# Patient Record
Sex: Female | Born: 1985 | Race: Black or African American | Hispanic: No | Marital: Single | Smoking: Never smoker
Health system: Southern US, Community
[De-identification: ages and names within clinical notes are randomized; demographics above are authoritative.]

## PROBLEM LIST (undated history)

## (undated) DIAGNOSIS — R002 Palpitations: Secondary | ICD-10-CM

## (undated) DIAGNOSIS — M419 Scoliosis, unspecified: Secondary | ICD-10-CM

## (undated) DIAGNOSIS — E785 Hyperlipidemia, unspecified: Secondary | ICD-10-CM

## (undated) DIAGNOSIS — H9190 Unspecified hearing loss, unspecified ear: Secondary | ICD-10-CM

## (undated) DIAGNOSIS — N281 Cyst of kidney, acquired: Secondary | ICD-10-CM

## (undated) DIAGNOSIS — J309 Allergic rhinitis, unspecified: Secondary | ICD-10-CM

## (undated) DIAGNOSIS — G44219 Episodic tension-type headache, not intractable: Secondary | ICD-10-CM

## (undated) DIAGNOSIS — N200 Calculus of kidney: Secondary | ICD-10-CM

## (undated) DIAGNOSIS — G4730 Sleep apnea, unspecified: Secondary | ICD-10-CM

## (undated) DIAGNOSIS — N946 Dysmenorrhea, unspecified: Secondary | ICD-10-CM

## (undated) DIAGNOSIS — K219 Gastro-esophageal reflux disease without esophagitis: Secondary | ICD-10-CM

## (undated) DIAGNOSIS — M40209 Unspecified kyphosis, site unspecified: Secondary | ICD-10-CM

## (undated) DIAGNOSIS — R42 Dizziness and giddiness: Secondary | ICD-10-CM

## (undated) DIAGNOSIS — Q775 Diastrophic dysplasia: Secondary | ICD-10-CM

## (undated) DIAGNOSIS — E34328 Other genetic causes of short stature: Secondary | ICD-10-CM

## (undated) DIAGNOSIS — J4530 Mild persistent asthma, uncomplicated: Secondary | ICD-10-CM

## (undated) DIAGNOSIS — F419 Anxiety disorder, unspecified: Secondary | ICD-10-CM

## (undated) HISTORY — DX: Other genetic causes of short stature: E34.328

## (undated) HISTORY — DX: Allergic rhinitis, unspecified: J30.9

## (undated) HISTORY — DX: Sleep apnea, unspecified: G47.30

## (undated) HISTORY — DX: Anxiety disorder, unspecified: F41.9

## (undated) HISTORY — DX: Unspecified hearing loss, unspecified ear: H91.90

## (undated) HISTORY — DX: Dizziness and giddiness: R42

## (undated) HISTORY — DX: Cyst of kidney, acquired: N28.1

## (undated) HISTORY — DX: Dysmenorrhea, unspecified: N94.6

## (undated) HISTORY — DX: Hyperlipidemia, unspecified: E78.5

## (undated) HISTORY — DX: Calculus of kidney: N20.0

## (undated) HISTORY — DX: Gastro-esophageal reflux disease without esophagitis: K21.9

## (undated) HISTORY — DX: Mild persistent asthma, uncomplicated: J45.30

## (undated) HISTORY — DX: Episodic tension-type headache, not intractable: G44.219

## (undated) HISTORY — PX: NO PAST SURGERIES: SHX2092

---

## 2008-04-05 ENCOUNTER — Emergency Department (HOSPITAL_COMMUNITY): Admission: EM | Admit: 2008-04-05 | Discharge: 2008-04-06 | Payer: Self-pay | Admitting: Emergency Medicine

## 2009-10-10 ENCOUNTER — Emergency Department (HOSPITAL_COMMUNITY): Admission: EM | Admit: 2009-10-10 | Discharge: 2009-10-10 | Payer: Self-pay | Admitting: Family Medicine

## 2012-10-11 ENCOUNTER — Emergency Department (HOSPITAL_BASED_OUTPATIENT_CLINIC_OR_DEPARTMENT_OTHER)
Admission: EM | Admit: 2012-10-11 | Discharge: 2012-10-12 | Disposition: A | Discharge status: Home / Self Care | Payer: Medicaid Other | Attending: Emergency Medicine | Admitting: Emergency Medicine

## 2012-10-11 ENCOUNTER — Encounter (HOSPITAL_BASED_OUTPATIENT_CLINIC_OR_DEPARTMENT_OTHER): Payer: Self-pay | Admitting: *Deleted

## 2012-10-11 DIAGNOSIS — R059 Cough, unspecified: Secondary | ICD-10-CM | POA: Insufficient documentation

## 2012-10-11 DIAGNOSIS — M4120 Other idiopathic scoliosis, site unspecified: Secondary | ICD-10-CM | POA: Insufficient documentation

## 2012-10-11 DIAGNOSIS — Q789 Osteochondrodysplasia, unspecified: Secondary | ICD-10-CM | POA: Insufficient documentation

## 2012-10-11 DIAGNOSIS — R05 Cough: Secondary | ICD-10-CM | POA: Insufficient documentation

## 2012-10-11 DIAGNOSIS — M4000 Postural kyphosis, site unspecified: Secondary | ICD-10-CM | POA: Insufficient documentation

## 2012-10-11 DIAGNOSIS — R002 Palpitations: Secondary | ICD-10-CM | POA: Insufficient documentation

## 2012-10-11 HISTORY — DX: Palpitations: R00.2

## 2012-10-11 HISTORY — DX: Unspecified kyphosis, site unspecified: M40.209

## 2012-10-11 HISTORY — DX: Diastrophic dysplasia: Q77.5

## 2012-10-11 HISTORY — DX: Scoliosis, unspecified: M41.9

## 2012-10-11 LAB — CBC WITH DIFFERENTIAL/PLATELET
Basophils Relative: 1 % (ref 0–1)
Eosinophils Relative: 2 % (ref 0–5)
Lymphocytes Relative: 19 % (ref 12–46)
Lymphs Abs: 1.6 10*3/uL (ref 0.7–4.0)
MCHC: 33.6 g/dL (ref 30.0–36.0)
Monocytes Relative: 10 % (ref 3–12)
Platelets: ADEQUATE 10*3/uL (ref 150–400)

## 2012-10-11 MED ORDER — SODIUM CHLORIDE 0.9 % IV SOLN
INTRAVENOUS | Status: DC
  Administered 2012-10-11: via INTRAVENOUS

## 2012-10-11 NOTE — ED Provider Notes (Signed)
History     CSN: [id number]  Arrival date & time 10/11/12  2006   First MD Initiated Contact with Patient 10/11/12 2009      Chief Complaint  Patient presents with  . Palpitations    (Consider location/radiation/quality/duration/timing/severity/associated sxs/prior Treatment)  HPI Cindy Goodwin is a 26 y.o. female who presents to the ED with palpations. She states that her heart rate is usually 95 to 115 but for over a week she has experienced episodes of fluttering and feeling of fullness in chest and throat. The symptoms are worse with cough. She only reports an occasional cough and no congestion, fever or chills. She denies shortness of breath. The patient has congenital deformities, Diastrophic dysplasia, scoliosis, kyphosis. 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   Past Medical History  Diagnosis Date  . Scoliosis   . Palpitations   . Diastrophic dysplasia   . Kyphosis     History reviewed. No pertinent past surgical history.  History reviewed. No pertinent family history.  History  Substance Use Topics  . Smoking status: Never Smoker   . Smokeless tobacco: Not on file  . Alcohol Use: No    OB History   Grav Para Term Preterm Abortions TAB SAB Ect Mult Living                  Review of Systems  Constitutional: Negative for fever, chills, diaphoresis and fatigue.  HENT: Negative for ear pain, congestion, sore throat, facial swelling, neck pain, neck stiffness, dental problem and sinus pressure.   Eyes: Negative for photophobia, pain and discharge.  Respiratory: Positive for cough. Negative for chest tightness and wheezing.   Cardiovascular: Positive for palpitations. Negative for chest pain and leg swelling.  Gastrointestinal: Negative for nausea, vomiting, abdominal pain, diarrhea, constipation and abdominal distention.  Genitourinary:  Negative for dysuria, frequency, flank pain and difficulty urinating.  Musculoskeletal: Negative for myalgias, back pain and gait problem.  Skin: Negative for color change and rash.  Neurological: Negative for dizziness, speech difficulty, weakness, light-headedness, numbness and headaches.  Psychiatric/Behavioral: Negative for confusion and agitation.    Allergies  Review of patient's allergies indicates no known allergies.  Home Medications  No current outpatient prescriptions on file.  BP   Pulse 110  Temp(Src) 98.3 F (36.8 C) (Oral)  Resp 20  Wt 65 lb (29.484 kg)  SpO2 100%  LMP 09/20/2012  Physical Exam  Nursing note and vitals reviewed. Constitutional: She is oriented to person, place, and time. No distress.  HENT:  Head: Atraumatic.  Eyes: EOM are normal.  Neck: Neck supple. Normal carotid pulses and no JVD present.  Cardiovascular: Tachycardia present.   Pulmonary/Chest: Effort normal and breath sounds normal. No respiratory distress.  Abdominal: Soft. There is no tenderness.  Musculoskeletal:  Severe scoliosis and kyphosis  Neurological: She is alert and oriented to person, place, and time.  Skin: Skin is warm and dry.  Psychiatric: She has a normal mood and affect. Her behavior is normal. Judgment and thought content normal.   Procedures RN unable to draw blood or start IV. Dr. Bernette Mayers in to evaluate the patient and try to start IV.  IV started and labs drawn.  Normal K   Assessment: 27 y.o. female with palpations   Patient does not appear to be at risk for PE or DVT  Plan:  Discussed with  the patient in detail need for further evaluation with a cardiologist   Patient voices understanding.    Discussed lab results with the patient. I discussed with the patient that if her symptoms worsen she should return here.  The patient's lab work and  EKG have been reviewed by Dr. Nicanor Alcon and Dr. Bernette Mayers and they feel that the patient can be discharged home to follow  up with Cardiology.    I discussed with the patient in detail need for follow up with Cardiology and to return if symptoms worsen. Patient voices understanding.  [place], NP 10/11/12 [address] NP 10/13/12 1104

## 2012-10-11 NOTE — ED Notes (Signed)
Pt states her HR usually runs 95-115, but for the past 1-1/2 weeks she has noticed episodes of fluttering feeling in chest and throat. Worse when coughing. Denies other s/s.

## 2012-10-11 NOTE — Procedures (Signed)
Asked by Dr Bernette Mayers to attempt arteral puncture to obtain blood for lab. Attempted X 1 to Lf radial with no success. Positive Allens test. Pt tolerated well.site held for 5 minutes. No bleeding noted

## 2012-10-12 LAB — COMPREHENSIVE METABOLIC PANEL: Chloride: 102 mEq/L (ref 96–112)

## 2012-10-13 NOTE — ED Provider Notes (Signed)
Medical screening examination/treatment/procedure(s) were conducted as a shared visit with non-physician practitioner(s) and myself.  I personally evaluated the patient during the encounter  Pt with multiple congenital skeletal deformities has palpitations in addition to baseline tachycardia. Multiple attempts at IV access and blood draws. I attempted US guided EJ unsuccessfully. Care signed out at the change of shift.   [name] B. Bernette Mayers, MD 10/13/12 1148

## 2012-12-15 ENCOUNTER — Institutional Professional Consult (permissible substitution): Payer: Self-pay | Admitting: Internal Medicine

## 2013-01-22 ENCOUNTER — Ambulatory Visit: Payer: Medicaid Other | Admitting: Physical Therapy

## 2013-01-29 ENCOUNTER — Ambulatory Visit: Payer: Medicaid Other | Admitting: Physical Therapy

## 2013-09-29 ENCOUNTER — Emergency Department (HOSPITAL_BASED_OUTPATIENT_CLINIC_OR_DEPARTMENT_OTHER)
Admission: EM | Admit: 2013-09-29 | Discharge: 2013-09-29 | Disposition: A | Discharge status: Home / Self Care | Payer: Medicaid Other | Attending: Emergency Medicine | Admitting: Emergency Medicine

## 2013-09-29 ENCOUNTER — Encounter (HOSPITAL_BASED_OUTPATIENT_CLINIC_OR_DEPARTMENT_OTHER): Payer: Self-pay | Admitting: Emergency Medicine

## 2013-09-29 ENCOUNTER — Emergency Department (HOSPITAL_BASED_OUTPATIENT_CLINIC_OR_DEPARTMENT_OTHER): Payer: Medicaid Other

## 2013-09-29 DIAGNOSIS — J069 Acute upper respiratory infection, unspecified: Secondary | ICD-10-CM | POA: Insufficient documentation

## 2013-09-29 DIAGNOSIS — R6252 Short stature (child): Secondary | ICD-10-CM | POA: Insufficient documentation

## 2013-09-29 DIAGNOSIS — Q788 Other specified osteochondrodysplasias: Secondary | ICD-10-CM | POA: Insufficient documentation

## 2013-09-29 DIAGNOSIS — M4120 Other idiopathic scoliosis, site unspecified: Secondary | ICD-10-CM | POA: Insufficient documentation

## 2013-09-29 DIAGNOSIS — R000 Tachycardia, unspecified: Secondary | ICD-10-CM | POA: Insufficient documentation

## 2013-09-29 MED ORDER — AZITHROMYCIN 100 MG/5ML PO SUSR: 150.0000 mg | Freq: Every day | ORAL | Status: AC

## 2013-09-29 MED ORDER — HYDROCODONE-ACETAMINOPHEN 7.5-325 MG/15ML PO SOLN: ORAL | Status: DC

## 2013-09-29 MED ORDER — AZITHROMYCIN 200 MG/5ML PO SUSR
300.0000 mg | Freq: Once | ORAL | Status: AC
  Administered 2013-09-29: 300 mg via ORAL
  Filled 2013-09-29: qty 10

## 2013-09-29 MED ORDER — BENZONATATE 100 MG PO CAPS: 100.0000 mg | ORAL_CAPSULE | Freq: Three times a day (TID) | ORAL | Status: DC

## 2013-09-29 MED ORDER — ACETAMINOPHEN 160 MG/5ML PO SOLN
450.0000 mg | Freq: Once | ORAL | Status: AC
Start: 2013-09-29 — End: 2013-09-29
  Administered 2013-09-29: 450 mg via ORAL
  Filled 2013-09-29: qty 20.3

## 2013-09-29 NOTE — ED Provider Notes (Signed)
CSN: [id number]     Arrival date & time 09/29/13  1605 History   First MD Initiated Contact with Patient 09/29/13 1719     Chief Complaint  Patient presents with  . Fever     (Consider location/radiation/quality/duration/timing/severity/associated sxs/prior Treatment) HPI 28 year old female with diastrophic dysplasia, kyphosis, and scoliosis with rhinorrhea, cough, and sneezing for the past 3 days. She has been taking by mouth as normal. She lives independently with her sister. She states she has been taking Tylenol which she takes for the appropriate wait. She had not noted that she had a fever at home. She last took Tylenol for 4 hours ago. She has been eating and drinking without difficulty. She has not had any vomiting or diarrhea. She felt that she was using some accessory muscles to breathe and thought she should be evaluated secondary to this but has not noted any productive cough or shortness of breath. Past Medical History  Diagnosis Date  . Scoliosis   . Palpitations   . Diastrophic dysplasia   . Kyphosis    History reviewed. No pertinent past surgical history. No family history on file. History  Substance Use Topics  . Smoking status: Never Smoker   . Smokeless tobacco: Not on file  . Alcohol Use: No   OB History   Grav Para Term Preterm Abortions TAB SAB Ect Mult Living                 Review of Systems  All other systems reviewed and are negative.      Allergies  Review of patient's allergies indicates no known allergies.  Home Medications  No current outpatient prescriptions on file. Pulse 102  Temp(Src) 100.5 F (38.1 C) (Oral)  Resp 20  Wt 68 lb (30.845 kg) Physical Exam  Nursing note and vitals reviewed. Constitutional: She is oriented to person, place, and time. She is not intubated.  Patient is extremely short stature very short arms and legs and scoliosis with abnormal shape to chest and trunk.  HENT:  Head: Atraumatic.  Nose: Nose normal.   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 Is unable to visualize her tympanic membranes.  Eyes: Conjunctivae and EOM are normal. Pupils are equal, round, and reactive to light.  Neck: Normal range of motion. Neck supple.  Cardiovascular: Tachycardia present.   Pulmonary/Chest: Breath sounds normal. No accessory muscle usage. No apnea, not tachypneic and not bradypneic. She is not intubated. No respiratory distress.  Abdominal: Normal appearance and bowel sounds are normal. There is no tenderness.  Musculoskeletal:  Foreshortened arms and legs.  Neurological: She is alert and oriented to person, place, and time. No cranial nerve deficit. Coordination normal.  Skin: Skin is warm and dry. No rash noted. No erythema. No pallor.  Psychiatric: She has a normal mood and affect. Her behavior is normal. Judgment and thought content normal.    ED Course  Procedures (including critical care time) Labs Review Labs Reviewed - No data to display Imaging Review Dg Chest 2 View  09/29/2013   CLINICAL DATA:  Cough and fever and chest pain.  EXAM: CHEST  2 VIEW  COMPARISON:  Chest x-rays dated 10/10/2009 and 04/05/2008  FINDINGS: There are no appreciable infiltrates or effusions. Heart size and pulmonary vascularity appear normal. There is marked deformity of the thoracic cage with a severe thoracic scoliosis. There is marked deformity of the shoulders and humeri.  IMPRESSION: No appreciable acute cardiopulmonary disease.   Electronically Signed   By: Clair Gulling  Maxwell M.D.   On: 09/29/2013 18:31      MDM    No obvious infiltrate on chest x-Aitanna Haubner. She is tachycardic here at 110 but states that this is her baseline. She has not been dyspneic and has normal oxygen saturations. Her mother requests that she be started on antibiotics doubt that is how she has been handled with URI symptoms in the past. I think that given her congenital abnormalities it is reasonable to start antibiotics. She will be  placed on Zithromax. They are advised to return or she appears worse at any time.  Shaune Pollack, MD 09/29/13 [phone]

## 2013-09-29 NOTE — ED Notes (Signed)
Fever  And cough x 2 days

## 2013-09-29 NOTE — Discharge Instructions (Signed)

## 2016-08-28 ENCOUNTER — Encounter: Payer: Medicaid Other | Attending: Internal Medicine | Admitting: Dietician

## 2016-08-28 ENCOUNTER — Encounter: Payer: Self-pay | Admitting: Dietician

## 2016-08-28 DIAGNOSIS — E343 Short stature due to endocrine disorder: Secondary | ICD-10-CM | POA: Insufficient documentation

## 2016-08-28 DIAGNOSIS — Z713 Dietary counseling and surveillance: Secondary | ICD-10-CM | POA: Insufficient documentation

## 2016-08-28 DIAGNOSIS — R635 Abnormal weight gain: Secondary | ICD-10-CM

## 2016-08-28 NOTE — Patient Instructions (Signed)
Consider using the Calorie [employer] app to compare food choices when eating out. Consider splitting a restaurant meal with someone or getting a to-go box at the beginning of the meal. Choose low fat foods and avoid adding a lot of added fat. Baked, broiled or grilled rather than fried. Stop eating when you are satisfied.  Consider eating away from the computer. For snacks, think of what is nutrient dense.  Put small portion in a bowl and avoid bringing the container. Consider getting back to the pool.

## 2016-08-28 NOTE — Progress Notes (Signed)
  Medical Nutrition Therapy:  Appt start time: 1410 end time:  1450.   Assessment:  Primary concerns today: Patient is here today with her sister.  Patient would like to lose weight.  She has dwarfism.   Weight was 65 lbs about 4 years ago and 73 lbs today.  She would like to return to 65 lbs.  Patient lives with her sister.  She has an Environmental consultant that comes each morning and she prepares a lot of her meals.  She works as a Education administrator.  Preferred Learning Style:   No preference indicated   Learning Readiness:   Ready  MEDICATIONS: see list   DIETARY INTAKE: Sleeps from 4 am-9 am, awake 9-10, then sleeps from 10-12. (7 hours sleep per night. Usual eating pattern includes 2 meals and 1 snacks per day. Eats in front of the computer. Everyday foods include eating out.  Avoided foods include less meat over the past month.  Fast at church currently (no bread or sweets or meat) but then resumes.  "more energy, cleaner"  States that she will eat past full "becasue it is good."  24-hr recall:  B ( AM): skips  Snk ( AM): peppermint or green tea with 1 spoon honey or sugar L (12 PM): cooked oatmeal, strawberries, and almond milk OR yogurt OR eggs and toast Snk ( PM): none D (4-5 PM): eats out a lot (not fast food usually) eats about 1/2:  Chinese (fried rice and sesame chicken, wonton soup) Snk (10 PM): cookies or ice cream or other sweet Beverages: juice before the new year and now more water  Usual physical activity: limited.  She will walk for 3-5 minutes.  Generally uses a wheel chair.  Enjoys swimming.  Progress Towards Goal(s):  In progress.   Nutritional Diagnosis:  NB-1.1 Food and nutrition-related knowledge deficit As related to nutrition for weight management.  As evidenced by weight gain.    Intervention:  Nutrition counseling/education related to healthy eating/mindful eating.  Discussed mindful choices, listening to her body.  Discussed benefits of physical activity  within her limitations.  Consider using the Calorie [employer] app to compare food choices when eating out. Consider splitting a restaurant meal with someone or getting a to-go box at the beginning of the meal. Choose low fat foods and avoid adding a lot of added fat. Baked, broiled or grilled rather than fried. Stop eating when you are satisfied.  Consider eating away from the computer. For snacks, think of what is nutrient dense.  Put small portion in a bowl and avoid bringing the container. Consider getting back to the pool.  Teaching Method Utilized:  Visual Auditory Hands on  Handouts given during visit include:  My plate  Snack list  Barriers to learning/adherence to lifestyle change: dwarfism and uses electric wheel chair  Demonstrated degree of understanding via:  Teach Back   Monitoring/Evaluation:  Dietary intake, exercise, and body weight prn.

## 2017-12-31 ENCOUNTER — Ambulatory Visit (INDEPENDENT_AMBULATORY_CARE_PROVIDER_SITE_OTHER): Payer: Medicaid Other | Admitting: Allergy and Immunology

## 2017-12-31 ENCOUNTER — Ambulatory Visit
Admission: RE | Admit: 2017-12-31 | Discharge: 2017-12-31 | Disposition: A | Discharge status: Home / Self Care | Payer: Medicaid Other | Source: Ambulatory Visit | Attending: Allergy and Immunology | Admitting: Allergy and Immunology

## 2017-12-31 ENCOUNTER — Encounter: Payer: Self-pay | Admitting: Allergy and Immunology

## 2017-12-31 VITALS — HR 82 | Temp 98.2°F | Resp 17 | Ht <= 58 in | Wt 73.0 lb

## 2017-12-31 DIAGNOSIS — R05 Cough: Secondary | ICD-10-CM

## 2017-12-31 DIAGNOSIS — J310 Chronic rhinitis: Secondary | ICD-10-CM | POA: Insufficient documentation

## 2017-12-31 DIAGNOSIS — J4530 Mild persistent asthma, uncomplicated: Secondary | ICD-10-CM | POA: Diagnosis not present

## 2017-12-31 DIAGNOSIS — K219 Gastro-esophageal reflux disease without esophagitis: Secondary | ICD-10-CM | POA: Insufficient documentation

## 2017-12-31 DIAGNOSIS — R053 Chronic cough: Secondary | ICD-10-CM

## 2017-12-31 HISTORY — DX: Mild persistent asthma, uncomplicated: J45.30

## 2017-12-31 MED ORDER — AZELASTINE HCL 0.1 % NA SOLN: 2.0000 | Freq: Two times a day (BID) | NASAL | 5 refills | Status: DC

## 2017-12-31 MED ORDER — FLUTICASONE PROPIONATE HFA 110 MCG/ACT IN AERO: 2.0000 | INHALATION_SPRAY | Freq: Two times a day (BID) | RESPIRATORY_TRACT | 5 refills | Status: DC

## 2017-12-31 MED ORDER — ALBUTEROL SULFATE HFA 108 (90 BASE) MCG/ACT IN AERS: 1.0000 | INHALATION_SPRAY | Freq: Four times a day (QID) | RESPIRATORY_TRACT | 1 refills | Status: AC | PRN

## 2017-12-31 MED ORDER — KARBINAL ER 4 MG/5ML PO SUER: 6.0000 mg | Freq: Two times a day (BID) | ORAL | 5 refills | Status: DC

## 2017-12-31 NOTE — Assessment & Plan Note (Signed)
Todays spirometry results, assessed while asymptomatic, suggest under-perception of bronchoconstriction.  A prescription has been provided for Flovent (fluticasone) 110 g, 2 inhalations twice a day. To maximize pulmonary deposition, a spacer has been provided along with instructions for its proper administration with an HFA inhaler.  A prescription has been provided for albuterol HFA, 1 to 2 inhalations every 6 hours if needed.  Subjective and objective measures of pulmonary function will be followed and the treatment plan will be adjusted accordingly. 

## 2017-12-31 NOTE — Assessment & Plan Note (Deleted)
   A prescription has been provided for [facility] ER (carbinoxamine) 6 mg twice daily as needed.  A prescription has been provided for azelastine nasal spray, 1-2 sprays per nostril 2 times daily as needed. Proper nasal spray technique has been discussed and demonstrated.   Nasal saline spray (i.e., Simply Saline) or nasal saline lavage (i.e., NeilMed) is recommended as needed and prior to medicated nasal sprays.

## 2017-12-31 NOTE — Assessment & Plan Note (Signed)
   A prescription has been provided for [facility] ER (carbinoxamine) 6 mg twice daily as needed.  A prescription has been provided for azelastine nasal spray, 1-2 sprays per nostril 2 times daily as needed. Proper nasal spray technique has been discussed and demonstrated.   Nasal saline spray (i.e., Simply Saline) or nasal saline lavage (i.e., NeilMed) is recommended as needed and prior to medicated nasal sprays.

## 2017-12-31 NOTE — Assessment & Plan Note (Addendum)
The most common causes of chronic cough include the following: upper airway cough syndrome (UACS) which is caused by variety of rhinosinus conditions; asthma; gastroesophageal reflux disease (GERD); chronic bronchitis from cigarette smoking or other inhaled environmental irritants; non-asthmatic eosinophilic bronchitis; and bronchiectasis. In prospective studies, these conditions have accounted for up to 94% of the causes of chronic cough in immunocompetent adults. The history and physical examination suggest that her cough may be multifactorial with contribution from bronchial hyper-responsiveness and possible postnasal drainage. We will address these issues at this time.   A prescription has been provided for a flutter valve to be used as needed to break the coughing cycle.  We will recheck chest x-ray as it is been 4 years since her last study.  Treatment plan as outlined below.

## 2017-12-31 NOTE — Patient Instructions (Addendum)
Cough, persistent The most common causes of chronic cough include the following: upper airway cough syndrome (UACS) which is caused by variety of rhinosinus conditions; asthma; gastroesophageal reflux disease (GERD); chronic bronchitis from cigarette smoking or other inhaled environmental irritants; non-asthmatic eosinophilic bronchitis; and bronchiectasis. In prospective studies, these conditions have accounted for up to 94% of the causes of chronic cough in immunocompetent adults. The history and physical examination suggest that her cough may be multifactorial with contribution from bronchial hyper-responsiveness and possible postnasal drainage. We will address these issues at this time.   A prescription has been provided for a flutter valve to be used as needed to break the coughing cycle.  We will recheck chest x-ray as it is been 4 years since her last study.  Treatment plan as outlined below.    Mild persistent asthma/cough variant asthma Todays spirometry results, assessed while asymptomatic, suggest under-perception of bronchoconstriction.  A prescription has been provided for Flovent (fluticasone) 110 g,  2 inhalations twice a day. To maximize pulmonary deposition, a spacer has been provided along with instructions for its proper administration with an HFA inhaler.  A prescription has been provided for albuterol HFA, 1 to 2 inhalations every 6 hours if needed.  Subjective and objective measures of pulmonary function will be followed and the treatment plan will be adjusted accordingly.  Nonallergic rhinitis  A prescription has been provided for [facility] ER (carbinoxamine) 6 mg twice daily as needed.  A prescription has been provided for azelastine nasal spray, 1-2 sprays per nostril 2 times daily as needed. Proper nasal spray technique has been discussed and demonstrated.   Nasal saline spray (i.e., Simply Saline) or nasal saline lavage (i.e., NeilMed) is recommended as needed and  prior to medicated nasal sprays.   Return in about 3 months (around 04/02/2018), or if symptoms worsen or fail to improve.

## 2017-12-31 NOTE — Progress Notes (Signed)
New Patient Note  RE: Cindy Goodwin MRN: [id number] DOB: June 05, 1986 Date of Office Visit: 12/31/2017  Referring provider: Nolene Ebbs, MD Primary care provider: Nolene Ebbs, MD  Chief Complaint: Cough and Nasal Congestion   History of present illness: Cindy Goodwin is a 32 y.o. female seen today in consultation requested by Nolene Ebbs, MD.  She complains of a persistent cough which she has had over the past 5 to 8 years.  She has tried multiple antihistamines, including loratadine, cetirizine, and levocetirizine without benefit.  In addition, she has tried albuterol without perceived benefit.  Cough syrups and Tessalon Perles have provided minimal/moderate temporary relief.  She reports that at times she has coughing fits to the point of almost vomiting.  The cough is described as nonproductive and at times originates at the base of her throat and other times originates in her chest.  There is no diurnal variation.  The cough is at its worst from October through December and March through July.  She states the cough "abruptly stops" in January and February and from July through September.  She denies chest tightness and wheezing.  She denies heartburn and water brash.  She does experience nasal congestion, rhinorrhea, and sneezing. These symptoms are most frequent and severe during the springtime.  She does not recall ever having had a chest x-ray.  She has not been evaluated for this problem by an otolaryngologist or pulmonologist.  Assessment and plan: Cough, persistent The most common causes of chronic cough include the following: upper airway cough syndrome (UACS) which is caused by variety of rhinosinus conditions; asthma; gastroesophageal reflux disease (GERD); chronic bronchitis from cigarette smoking or other inhaled environmental irritants; non-asthmatic eosinophilic bronchitis; and bronchiectasis. In prospective studies, these conditions have accounted for up to 94% of the  causes of chronic cough in immunocompetent adults. The history and physical examination suggest that her cough may be multifactorial with contribution from bronchial hyper-responsiveness and possible postnasal drainage. We will address these issues at this time.   A prescription has been provided for a flutter valve to be used as needed to break the coughing cycle.  We will recheck chest x-ray as it is been 4 years since her last study.  Treatment plan as outlined below.    Mild persistent asthma/cough variant asthma Todays spirometry results, assessed while asymptomatic, suggest under-perception of bronchoconstriction.  A prescription has been provided for Flovent (fluticasone) 110 g,  2 inhalations twice a day. To maximize pulmonary deposition, a spacer has been provided along with instructions for its proper administration with an HFA inhaler.  A prescription has been provided for albuterol HFA, 1 to 2 inhalations every 6 hours if needed.  Subjective and objective measures of pulmonary function will be followed and the treatment plan will be adjusted accordingly.  Nonallergic rhinitis  A prescription has been provided for [facility] ER (carbinoxamine) 6 mg twice daily as needed.  A prescription has been provided for azelastine nasal spray, 1-2 sprays per nostril 2 times daily as needed. Proper nasal spray technique has been discussed and demonstrated.   Nasal saline spray (i.e., Simply Saline) or nasal saline lavage (i.e., NeilMed) is recommended as needed and prior to medicated nasal sprays.   Meds ordered this encounter  Medications  . albuterol (PROVENTIL HFA;VENTOLIN HFA) 108 (90 Base) MCG/ACT inhaler    Sig: Inhale 1-2 puffs into the lungs every 6 (six) hours as needed for wheezing or shortness of breath.    Dispense:  18 g  Refill:  1  . KARBINAL ER 4 MG/5ML SUER    Sig: Take 6 mg by mouth 2 (two) times daily.    Dispense:  480 mL    Refill:  5  . fluticasone (FLOVENT  HFA) 110 MCG/ACT inhaler    Sig: Inhale 2 puffs into the lungs 2 (two) times daily. With spacer    Dispense:  1 Inhaler    Refill:  5  . azelastine (ASTELIN) 0.1 % nasal spray    Sig: Place 2 sprays into both nostrils 2 (two) times daily.    Dispense:  30 mL    Refill:  5    Diagnostics: Spirometry: FVC was 0.47 L and FEV1 was 0.39 L with significant (18%) postbronchodilator improvement.  This study was performed while the patient was asymptomatic.  Please see scanned spirometry results for details. Epicutaneous testing: Negative despite a positive histamine control. Intradermal testing: Negative. Chest xray 2 view interpretation: Chronic changes without acute abnormalities.    Physical examination: Pulse 82, temperature 98.2 F (36.8 C), temperature source Oral, resp. rate 17, height 2\' 2"  (0.66 m), weight 73 lb (33.1 kg), SpO2 97 %.  General: Dwarfism, alert, interactive, in no acute distress. HEENT: TMs pearly gray, turbinates moderately edematous without discharge, post-pharynx moderately erythematous. Neck: Supple without lymphadenopathy. Lungs: Clear to auscultation without wheezing, rhonchi or rales. CV: Normal S1, S2 without murmurs. Abdomen: Nondistended, nontender. Skin: Warm and dry, without lesions or rashes. Extremities:  No clubbing, cyanosis or edema. Neuro:   Grossly intact.  Review of systems:  Review of systems negative except as noted in HPI / PMHx or noted below: Review of Systems  Constitutional: Negative.   HENT: Negative.   Eyes: Negative.   Respiratory: Negative.   Cardiovascular: Negative.   Gastrointestinal: Negative.   Genitourinary: Negative.   Musculoskeletal: Negative.   Skin: Negative.   Neurological: Negative.   Endo/Heme/Allergies: Negative.   Psychiatric/Behavioral: Negative.     Past medical history:  Past Medical History:  Diagnosis Date  . Diastrophic dysplasia   . Kyphosis   . Mild persistent asthma 12/31/2017  . Palpitations    . Scoliosis     Past surgical history:  Past Surgical History:  Procedure Laterality Date  . NO PAST SURGERIES      Family history: Family History  Problem Relation Age of Onset  . Allergic rhinitis Maternal Aunt   . Allergic rhinitis Maternal Uncle     Social history: Social History   Socioeconomic History  . Marital status: Single    Spouse name: Not on file  . Number of children: Not on file  . Years of education: Not on file  . Highest education level: Not on file  Occupational History  . Not on file  Social Needs  . Financial resource strain: Not on file  . Food insecurity:    Worry: Not on file    Inability: Not on file  . Transportation needs:    Medical: Not on file    Non-medical: Not on file  Tobacco Use  . Smoking status: Never Smoker  . Smokeless tobacco: Never Used  Substance and Sexual Activity  . Alcohol use: No  . Drug use: No  . Sexual activity: Never    Birth control/protection: None  Lifestyle  . Physical activity:    Days per week: Not on file    Minutes per session: Not on file  . Stress: Not on file  Relationships  . Social connections:    Talks  on phone: Not on file    Gets together: Not on file    Attends religious service: Not on file    Active member of club or organization: Not on file    Attends meetings of clubs or organizations: Not on file    Relationship status: Not on file  . Intimate partner violence:    Fear of current or ex partner: Not on file    Emotionally abused: Not on file    Physically abused: Not on file    Forced sexual activity: Not on file  Other Topics Concern  . Not on file  Social History Narrative  . Not on file   Environmental History: The patient lives in an apartment with carpeting in the bedroom and central air/heat.  She is a non-smoker without pets.  There is no known mold/water damage in the home.  Allergies as of 12/31/2017   No Known Allergies     Medication List        Accurate as  of 12/31/17  5:14 PM. Always use your most recent med list.          albuterol 108 (90 Base) MCG/ACT inhaler Commonly known as:  PROVENTIL HFA;VENTOLIN HFA Inhale 1-2 puffs into the lungs every 6 (six) hours as needed for wheezing or shortness of breath.   azelastine 0.1 % nasal spray Commonly known as:  ASTELIN Place 2 sprays into both nostrils 2 (two) times daily.   benzonatate 100 MG capsule Commonly known as:  TESSALON Take 1 capsule (100 mg total) by mouth every 8 (eight) hours.   fluticasone 110 MCG/ACT inhaler Commonly known as:  FLOVENT HFA Inhale 2 puffs into the lungs 2 (two) times daily. With spacer   HYDROcodone-acetaminophen 7.5-325 mg/15 ml solution Commonly known as:  HYCET 5 ml po q four hours prn cough   KARBINAL ER 4 MG/5ML Suer Generic drug:  Carbinoxamine Maleate ER Take 6 mg by mouth 2 (two) times daily.       Known medication allergies: No Known Allergies  I appreciate the opportunity to take part in [facility] care. Please do not hesitate to contact me with questions.  Sincerely,   R. Edgar Frisk, MD

## 2018-06-06 ENCOUNTER — Emergency Department (HOSPITAL_BASED_OUTPATIENT_CLINIC_OR_DEPARTMENT_OTHER)
Admission: EM | Admit: 2018-06-06 | Discharge: 2018-06-06 | Disposition: A | Discharge status: Home / Self Care | Payer: Medicaid Other | Attending: Emergency Medicine | Admitting: Emergency Medicine

## 2018-06-06 ENCOUNTER — Other Ambulatory Visit: Payer: Self-pay

## 2018-06-06 ENCOUNTER — Encounter (HOSPITAL_BASED_OUTPATIENT_CLINIC_OR_DEPARTMENT_OTHER): Payer: Self-pay

## 2018-06-06 DIAGNOSIS — Z79899 Other long term (current) drug therapy: Secondary | ICD-10-CM | POA: Insufficient documentation

## 2018-06-06 DIAGNOSIS — K429 Umbilical hernia without obstruction or gangrene: Secondary | ICD-10-CM

## 2018-06-06 DIAGNOSIS — R1907 Generalized intra-abdominal and pelvic swelling, mass and lump: Secondary | ICD-10-CM | POA: Diagnosis present

## 2018-06-06 NOTE — ED Triage Notes (Signed)
C/o lump area to umbilicus x 2 days-was sent from [place] for possible hernia per pt-pt to triage in own w/c-NAD

## 2018-06-06 NOTE — ED Provider Notes (Addendum)
[facility] EMERGENCY DEPARTMENT Provider Note   CSN: [id number] Arrival date & time: 06/06/18  1343     History   Chief Complaint Chief Complaint  Patient presents with  . Mass    HPI Cindy Goodwin is a 32 y.o. female resenting for evaluation of abdominal mass.  Patient states over the past year, she has had intermittent umbilical discomfort.  Discomfort is normally resolved with having a bowel movement.  However, over the past 24 hours, discomfort has remained constant.  She states it feels abnormal, but she is not in acute pain.  She denies fevers, chills, chest pain, shortness breath, nausea, vomiting, urinary symptoms, abnormal bowel movements.  She has not tried anything for pain including Tylenol or ibuprofen.  She denies being told that she had a hernia in the past.  She has no history of abdominal surgeries.  Is tolerating p.o. without difficulty.  [facility] to the [facility] for further evaluation.  HPI  Past Medical History:  Diagnosis Date  . Diastrophic dysplasia   . Kyphosis   . Mild persistent asthma 12/31/2017  . Palpitations   . Scoliosis     Patient Active Problem List   Diagnosis Date Noted  . Cough, persistent 12/31/2017  . 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 Mild persistent asthma/cough variant asthma 12/31/2017  . Chronic rhinitis 12/31/2017    Past Surgical History:  Procedure Laterality Date  . NO PAST SURGERIES       OB History   None      Home Medications    Prior to Admission medications   Medication Sig Start Date End Date Taking? Authorizing Provider  albuterol (PROVENTIL HFA;VENTOLIN HFA) 108 (90 Base) MCG/ACT inhaler Inhale 1-2 puffs into the lungs every 6 (six) hours as needed for wheezing or shortness of breath. 12/31/17   Bobbitt, Sedalia Muta, MD  azelastine (ASTELIN) 0.1 % nasal spray Place 2 sprays into both nostrils 2 (two) times daily. 12/31/17   Bobbitt, Sedalia Muta, MD  benzonatate (TESSALON)  100 MG capsule Take 1 capsule (100 mg total) by mouth every 8 (eight) hours. Patient not taking: Reported on 08/28/2016 09/29/13   Pattricia Boss, MD  fluticasone (FLOVENT HFA) 110 MCG/ACT inhaler Inhale 2 puffs into the lungs 2 (two) times daily. With spacer 12/31/17   Bobbitt, Sedalia Muta, MD  HYDROcodone-acetaminophen (HYCET) 7.5-325 mg/15 ml solution 5 ml po q four hours prn cough Patient not taking: Reported on 08/28/2016 09/29/13   Pattricia Boss, MD  [facility] ER 4 MG/5ML SUER Take 6 mg by mouth 2 (two) times daily. 12/31/17   Bobbitt, Sedalia Muta, MD    Family History Family History  Problem Relation Age of Onset  . Allergic rhinitis Maternal Aunt   . Allergic rhinitis Maternal Uncle     Social History Social History   Tobacco Use  . Smoking status: Never Smoker  . Smokeless tobacco: Never Used  Substance Use Topics  . Alcohol use: No  . Drug use: No     Allergies   Patient has no known allergies.   Review of Systems Review of Systems  Gastrointestinal:       Umbilical discomfort and mass  All other systems reviewed and are negative.    Physical Exam Updated Vital Signs BP 122/77 (BP Location: Right Arm)   Pulse (!) 110   Temp 98.4 F (36.9 C) (Oral)   Resp 20   Wt 32.7 kg   LMP 05/30/2018   SpO2 98%  BMI 74.88 kg/m   Physical Exam  Constitutional: She is oriented to person, place, and time. She appears well-developed and well-nourished. No distress.  Resting comfortably in the bed in no acute distress. 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  Eyes: Pupils are equal, round, and reactive to light. Conjunctivae and EOM are normal.  Neck: Normal range of motion. Neck supple.  Cardiovascular: Regular rhythm and intact distal pulses.  tachycardic  Pulmonary/Chest: Effort normal and breath sounds normal. No respiratory distress. She has no wheezes.  Abdominal: Soft. She exhibits no distension and no mass. There is no  tenderness. There is no rebound and no guarding. A hernia is present. Hernia confirmed positive in the ventral area.  Soft, nontender, reducible umbilical hernia.  Otherwise abdomen is soft and nontender.  No rigidity, guarding, distention.  No other masses palpated.  Negative rebound.  No signs of peritonitis.  Musculoskeletal: Normal range of motion.  Neurological: She is alert and oriented to person, place, and time.  Skin: Skin is warm and dry. Capillary refill takes less than 2 seconds.  Psychiatric: She has a normal mood and affect.  Nursing note and vitals reviewed.    ED Treatments / Results  Labs (all labs ordered are listed, but only abnormal results are displayed) Labs Reviewed - No data to display  EKG None  Radiology No results found.  Procedures Procedures (including critical care time)  Medications Ordered in ED Medications - No data to display   Initial Impression / Assessment and Plan / ED Course  I have reviewed the triage vital signs and the nursing notes.  Pertinent labs & imaging results that were available during my care of the patient were reviewed by me and considered in my medical decision making (see chart for details).     Pt presenting for evaluation of possible umbilical hernia.  Physical exam reassuring, she is afebrile and appears nontoxic.  Heart rate is mildly elevated, patient states she is very anxious about what is going on, and states that she normally runs in the tachycardic side. I do not believe tachycardia is due to patients condition.  HR improved as pt was calmed. Abdominal exam is reassuring, she has a soft, nontender, and reducible umbilical hernia.  No signs of incarceration on physical exam.  No abdominal tenderness.  Per history, low suspicion for incarceration.  Discussed findings with patient.  Discussed etiology of hernias, and signs of incarcerated/strangulated hernia.  Discussed follow-up with general surgery for further  evaluation and management.  Case discussed with attending, Dr. Laverta Baltimore evaluated the patient.  At this time, patient appears safe for discharge.  Return precautions given.  Patient states she understands and agrees to plan.   Final Clinical Impressions(s) / ED Diagnoses   Final diagnoses:  Umbilical hernia without obstruction and without gangrene    ED Discharge Orders    None          Franchot Heidelberg, PA-C 06/06/18 1453    Long, Wonda Olds, MD 06/06/18 [phone]

## 2018-06-06 NOTE — ED Notes (Signed)
C/o abd mass x 2 days  slight dull ache

## 2018-06-06 NOTE — Discharge Instructions (Addendum)
Follow-up with surgery regarding hernia and options of management. You may use tylenol/ibuprofen as needed for pain. Return to the emergency room if you develop high fevers, severe pain, redness/hardness over the bellybutton, persistent vomiting, difficulty with having a bowel movement, or any new or concerning symptoms.

## 2018-11-04 IMAGING — CR DG CHEST 2V
2 series · 2 of 2 positions shown · non-contrast
Comparison: 09/29/2013

CLINICAL DATA: Intermittent dry cough

EXAM:
CHEST - 2 VIEW

[w chest pa 8-[id] (15-22cm)]
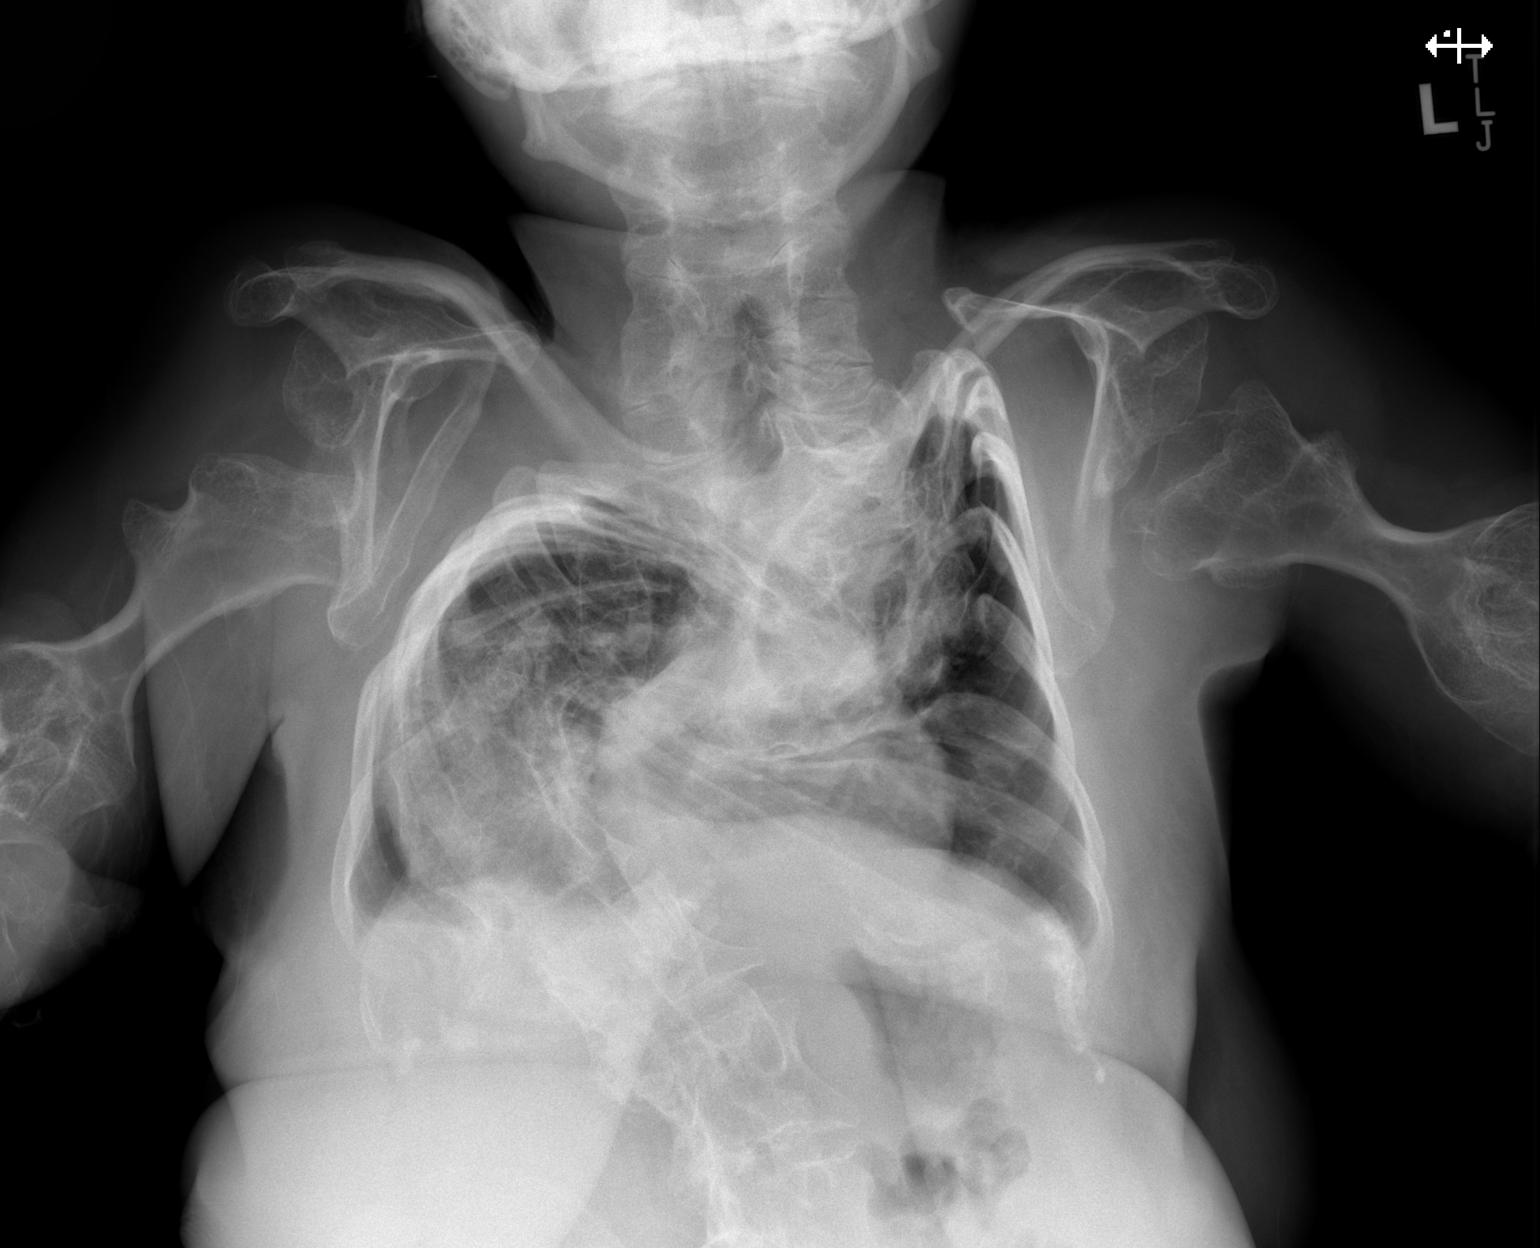

[w chest lat 8-[id] (21-28cm)]
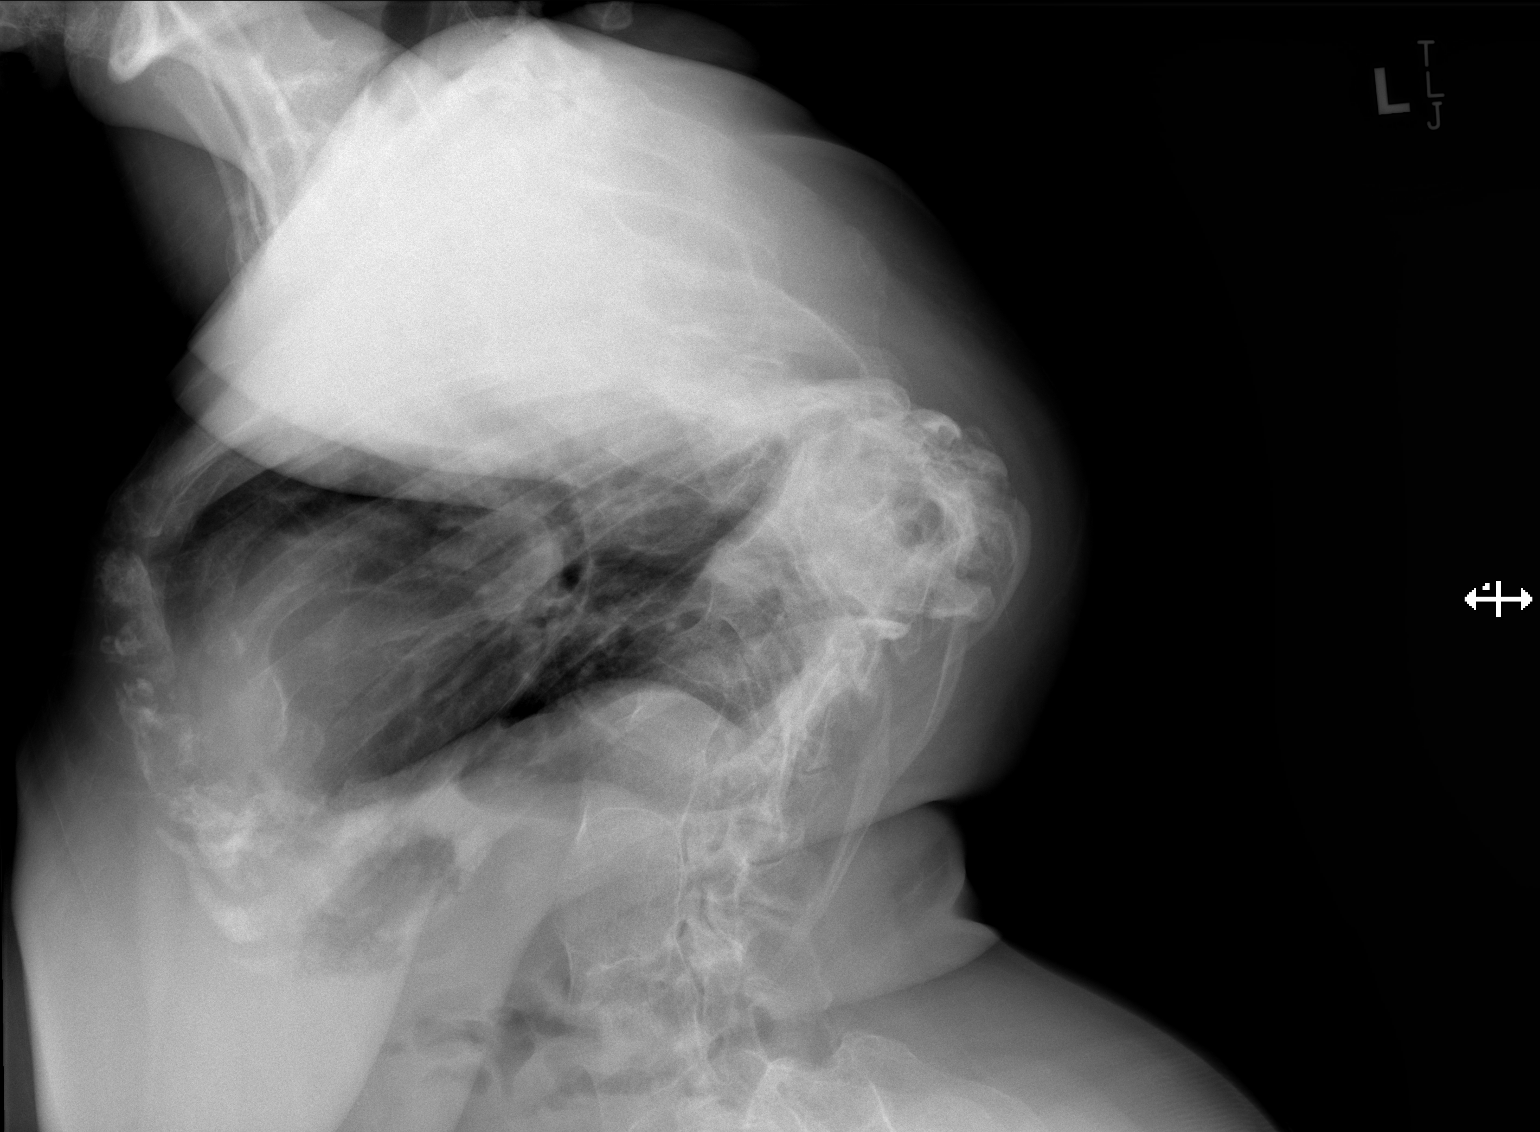

[2 of 2 positions shown; findings below may reference images not displayed]

FINDINGS: Cardiac shadow is stable. Lungs are well aerated. Severe scoliosis
of the thoracic spine is noted stable from the prior exam.
Shortening of the humeri are noted bilaterally. These bony changes
are consistent with the patient's given clinical history.
IMPRESSION: Chronic changes without acute abnormality.

## 2019-07-15 ENCOUNTER — Other Ambulatory Visit: Payer: Self-pay

## 2019-07-15 ENCOUNTER — Other Ambulatory Visit (HOSPITAL_COMMUNITY)
Admission: RE | Admit: 2019-07-15 | Discharge: 2019-07-15 | Disposition: A | Discharge status: Home / Self Care | Payer: Medicaid Other | Source: Ambulatory Visit | Attending: Obstetrics & Gynecology | Admitting: Obstetrics & Gynecology

## 2019-07-15 ENCOUNTER — Encounter: Payer: Self-pay | Admitting: Obstetrics & Gynecology

## 2019-07-15 ENCOUNTER — Ambulatory Visit (INDEPENDENT_AMBULATORY_CARE_PROVIDER_SITE_OTHER): Payer: Medicaid Other | Admitting: Obstetrics & Gynecology

## 2019-07-15 VITALS — BP 97/59 | HR 119

## 2019-07-15 DIAGNOSIS — D251 Intramural leiomyoma of uterus: Secondary | ICD-10-CM | POA: Diagnosis not present

## 2019-07-15 DIAGNOSIS — Z0000 Encounter for general adult medical examination without abnormal findings: Secondary | ICD-10-CM

## 2019-07-15 DIAGNOSIS — Z01419 Encounter for gynecological examination (general) (routine) without abnormal findings: Secondary | ICD-10-CM

## 2019-07-15 NOTE — Progress Notes (Signed)
Patient is here for evaluation of fibroids found on imaging at [facility] RN

## 2019-07-15 NOTE — Patient Instructions (Signed)

## 2019-07-15 NOTE — Progress Notes (Signed)
Subjective:     Cindy Goodwin is a 33 y.o. female here for a routine exam. G0 with reg monthly cycles lasting 5 days. 1 day may be heavy. Changes pads 3x/day. Reports increased white discharge just prior to menses. Pt has never had a pelvic exam.  Current complaints: fibroid sseen on CT scan done in the ED for pelvic pain.      Gynecologic History No LMP recorded. Contraception: abstinence Last Pap: never had Last mammogram: n/a Obstetric History OB History  No obstetric history on file.   The following portions of the patient's history were reviewed and updated as appropriate: allergies, current medications, past family history, past medical history, past social history, past surgical history and problem list.  Review of Systems Pertinent items are noted in HPI.    Objective:  BP (!) 97/59   Pulse (!) 119  General Appearance:    Alert, cooperative, no distress, appears stated age; pt is very pleasant with great sense of humor. Pt is a small person with wheelchair assist. She does ambulate.   Head:    Normocephalic, without obvious abnormality, atraumatic  Eyes:    conjunctiva/corneas clear, EOM's intact, both eyes  Ears:    Normal external ear canals, both ears  Nose:   Nares normal, septum midline, mucosa normal, no drainage    or sinus tenderness  Throat:   Lips, mucosa, and tongue normal; teeth and gums normal  Neck:   Supple, symmetrical, trachea midline, no adenopathy;    thyroid:  no enlargement/tenderness/nodules     Lungs:     respirations unlabored  Chest Wall:    No tenderness or deformity   Heart:    Regular rate and rhythm  Breast Exam:   not examined  Abdomen:     Soft, non-tender, bowel sounds active all four quadrants,    no masses, no organomegaly; strong abd muscles. Difficult to fully assess if fundus is palpable abdominally.   Genitalia:    Normal female without lesion, discharge or tenderness   Pt has a full length vagina and non parous cervix.  The uterus  is enlarged ~10-12 com; mobile, NT .   Extremities:   Extremities: limbs shortened.    Pulses:   2+ and symmetric all extremities  Skin:   Skin color, texture, turgor normal, no rashes or lesions    05/23/2019 1. Renal sinus fat appears slightly more hazy on the right with slightly less distinct renal contour compared to the left. Recommend correlation with urinalysis for possible pyelonephritis. Please note that noncontrast CT technique is less sensitive for infection. 2. Large mass within the abdomen without aggressive margins measuring 10.3 x 6.3 x 10.1 cm and appears continuous with the enlarged appearing uterus and may represent a fibroid. 3. Chronic skeletal findings as described Assessment:    Healthy female exam.   Uterine fibroids- large but, asymptomatic. I reviewed options with pts. We discussed briefly her future plans for potential childbearing. She reports that she was not prev given this as an option. I explained that is certainly an option but, would be a high risk pregnancy. We reviewed her anatomy which is full size female anatomy.   Reviewed options for observation and myomectomy if she opts for maintenance of fertility.  No measure needed at present.     Plan:    Follow up in: 3 months.  Or sooner prn F/u PAP with hrHPV F/u 1 year for annual   Cindy Goodwin, M.D., Cindy Goodwin

## 2019-07-17 ENCOUNTER — Other Ambulatory Visit: Payer: Self-pay | Admitting: Obstetrics & Gynecology

## 2019-07-17 ENCOUNTER — Encounter: Payer: Self-pay | Admitting: Obstetrics & Gynecology

## 2019-07-17 LAB — CYTOLOGY - PAP
Comment: NEGATIVE
Diagnosis: NEGATIVE
High risk HPV: NEGATIVE

## 2020-03-16 ENCOUNTER — Ambulatory Visit (INDEPENDENT_AMBULATORY_CARE_PROVIDER_SITE_OTHER): Payer: Medicaid Other | Admitting: Obstetrics & Gynecology

## 2020-03-16 ENCOUNTER — Other Ambulatory Visit: Payer: Self-pay

## 2020-03-16 ENCOUNTER — Encounter: Payer: Self-pay | Admitting: Obstetrics & Gynecology

## 2020-03-16 VITALS — BP 96/62 | HR 99

## 2020-03-16 DIAGNOSIS — D251 Intramural leiomyoma of uterus: Secondary | ICD-10-CM | POA: Diagnosis not present

## 2020-03-16 DIAGNOSIS — R102 Pelvic and perineal pain: Secondary | ICD-10-CM

## 2020-03-16 NOTE — Progress Notes (Signed)
History:  34 y.o. G0 presents for f/u of her uterine fibroids. She has been asymptomatic but recently felts a pain in her abd and felt like there was movement ans is concerned that the fibroids are growing and that she might be developing sx. Her menses come monthly. She reports cycles lasting 5-7 days using 3 pads on the heavy days. She does not report pain most days and really was only concerned about 1 episode.   The following portions of the patient's history were reviewed and updated as appropriate: allergies, current medications, past family history, past medical history, past social history, past surgical history and problem list.  Review of Systems:  Pertinent items are noted in HPI.    Objective:  Physical Exam Blood pressure 96/62, pulse 99, last menstrual period 03/03/2020.  CONSTITUTIONAL: Well-developed, well-nourished female in no acute distress. She is a small person in a customized wheel chair.  HENT:  Normocephalic, atraumatic EYES: Conjunctivae and EOM are normal. No scleral icterus.  NECK: Normal range of motion SKIN: Skin is warm and dry. No rash noted. Not diaphoretic.No pallor. [place]: Alert and oriented to person, place, and time. Normal coordination.  Abd: Soft, nontender and nondistended; no change in her abd exam. There is a palpable mass in the lower pelvis c/w fibroids not changed from prev exam.  Pelvic: not repeated.   Labs and Imaging 05/23/2019 1. Renal sinus fat appears slightly more hazy on the right with slightly less distinct renal contour compared to the left. Recommend correlation with urinalysis for possible pyelonephritis. Please note that noncontrast CT technique is less sensitive for infection. 2. Large mass within the abdomen without aggressive margins measuring 10.3 x 6.3 x 10.1 cm and appears continuous with the enlarged appearing uterus and may represent a fibroid. 3. Chronic skeletal findings as described  Assessment & Plan:  Fibroids-  only mildly symptomatic. Given lack of changes, would not recommend tx at present. I have reviewed with her the options for mangement of fibroids in someone who wants to maintain fertility and someone who does not. I do not believe that she needs any treatment at the time. rec observation.    Reviewed fertility issues including the fact that a pregnancy would be high risk for her but, not impossible. Also discussed option of egg donation with surrogate if desired. She is not interested in either at this time but, had questions due to her age.   Total face-to-face time with patient was 25 min.  Greater than 50% was spent in counseling and coordination of care with the patient.  F/u in 6 months or sooner prn   Camarie Mctigue L. Harraway-Smith, M.D., [name]

## 2020-03-16 NOTE — Patient Instructions (Signed)
Uterine Fibroids  Uterine fibroids (leiomyomas) are noncancerous (benign) tumors that can develop in the uterus. Fibroids may also develop in the fallopian tubes, cervix, or tissues (ligaments) near the uterus. You may have one or many fibroids. Fibroids vary in size, weight, and where they grow in the uterus. Some can become quite large. Most fibroids do not require medical treatment. What are the causes? The cause of this condition is not known. What increases the risk? You are more likely to develop this condition if you:  Are in your 30s or 40s and have not gone through menopause.  Have a family history of this condition.  Are of African-American descent.  Had your first period at an early age (early menarche).  Have not had any children (nulliparity).  Are overweight or obese. What are the signs or symptoms? Many women do not have any symptoms. Symptoms of this condition may include:  Heavy menstrual bleeding.  Bleeding or spotting between periods.  Pain and pressure in the pelvic area, between the hips.  Bladder problems, such as needing to urinate urgently or more often than usual.  Inability to have children (infertility).  Failure to carry pregnancy to term (miscarriage). How is this diagnosed? This condition may be diagnosed based on:  Your symptoms and medical history.  A physical exam.  A pelvic exam that includes feeling for any tumors.  Imaging tests, such as ultrasound or MRI. How is this treated? Treatment for this condition may include:  Seeing your health care provider for follow-up visits to monitor your fibroids for any changes.  Taking NSAIDs such as ibuprofen, naproxen, or aspirin to reduce pain.  Hormone medicines. These may be taken as a pill, given in an injection, or delivered by a T-shaped device that is inserted into the uterus (intrauterine device, IUD).  Surgery to remove one of the following: ? The fibroids (myomectomy). Your health  care provider may recommend this if fibroids affect your fertility and you want to become pregnant. ? The uterus (hysterectomy). ? Blood supply to the fibroids (uterine artery embolization). Follow these instructions at home:  Take over-the-counter and prescription medicines only as told by your health care provider.  Ask your health care provider if you should take iron pills or eat more iron-rich foods, such as dark green, leafy vegetables. Heavy menstrual bleeding can cause low iron levels.  If directed, apply heat to your back or abdomen to reduce pain. Use the heat source that your health care provider recommends, such as a moist heat pack or a heating pad. ? Place a towel between your skin and the heat source. ? Leave the heat on for 20-30 minutes. ? Remove the heat if your skin turns bright red. This is especially important if you are unable to feel pain, heat, or cold. You may have a greater risk of getting burned.  Pay close attention to your menstrual cycle. Tell your health care provider about any changes, such as: ? Increased blood flow that requires you to use more pads or tampons than usual. ? A change in the number of days that your period lasts. ? A change in symptoms that are associated with your period, such as back pain or cramps in your abdomen.  Keep all follow-up visits as told by your health care provider. This is important, especially if your fibroids need to be monitored for any changes. Contact a health care provider if you:  Have pelvic pain, back pain, or cramps in your abdomen that   do not get better with medicine or heat.  Develop new bleeding between periods.  Have increased bleeding during or between periods.  Feel unusually tired or weak.  Feel light-headed. Get help right away if you:  Faint.  Have pelvic pain that suddenly gets worse.  Have severe vaginal bleeding that soaks a tampon or pad in 30 minutes or less. Summary  Uterine fibroids are  noncancerous (benign) tumors that can develop in the uterus.  The exact cause of this condition is not known.  Most fibroids do not require medical treatment unless they affect your ability to have children (fertility).  Contact a health care provider if you have pelvic pain, back pain, or cramps in your abdomen that do not get better with medicines.  Make sure you know what symptoms should cause you to get help right away. This information is not intended to replace advice given to you by your health care provider. Make sure you discuss any questions you have with your health care provider. Document Revised: 07/12/2017 Document Reviewed: 06/25/2017 Elsevier Patient Education  2020 [employer]. Menorrhagia  Menorrhagia is a condition in which menstrual periods are heavy or last longer than normal. With menorrhagia, most periods a woman has may cause enough blood loss and cramping that she becomes unable to take part in her usual activities. What are the causes? Common causes of this condition include:  Noncancerous growths in the uterus (polyps or fibroids).  An imbalance of the estrogen and progesterone hormones.  One of the ovaries not releasing an egg during one or more months.  A problem with the thyroid gland (hypothyroid).  Side effects of having an intrauterine device (IUD).  Side effects of some medicines, such as anti-inflammatory medicines or blood thinners.  A bleeding disorder that stops the blood from clotting normally. In some cases, the cause of this condition is not known. What are the signs or symptoms? Symptoms of this condition include:  Routinely having to change your pad or tampon every 1-2 hours because it is completely soaked.  Needing to use pads and tampons at the same time because of heavy bleeding.  Needing to wake up to change your pads or tampons during the night.  Passing blood clots larger than 1 inch (2.5 cm) in size.  Having bleeding that  lasts for more than 7 days.  Having symptoms of low iron levels (anemia), such as tiredness, fatigue, or shortness of breath. How is this diagnosed? This condition may be diagnosed based on:  A physical exam.  Your symptoms and menstrual history.  Tests, such as: ? Blood tests to check if you are pregnant or have hormonal changes, a bleeding or thyroid disorder, anemia, or other problems. ? Pap test to check for cancerous changes, infections, or inflammation. ? Endometrial biopsy. This test involves removing a tissue sample from the lining of the uterus (endometrium) to be examined under a microscope. ? Pelvic ultrasound. This test uses sound waves to create images of your uterus, ovaries, and vagina. The images can show if you have fibroids or other growths. ? Hysteroscopy. For this test, a small telescope is used to look inside your uterus. How is this treated? Treatment may not be needed for this condition. If it is needed, the best treatment for you will depend on:  Whether you need to prevent pregnancy.  Your desire to have children in the future.  The cause and severity of your bleeding.  Your personal preference. Medicines are the first step  in treatment. You may be treated with:  Hormonal birth control methods. These treatments reduce bleeding during your menstrual period. They include: ? Birth control pills. ? Skin patch. ? Vaginal ring. ? Shots (injections) that you get every 3 months. ? Hormonal IUD (intrauterine device). ? Implants that go under the skin.  Medicines that thicken blood and slow bleeding.  Medicines that reduce swelling, such as ibuprofen.  Medicines that contain an artificial (synthetic) hormone called progestin.  Medicines that make the ovaries stop working for a short time.  Iron supplements to treat anemia. If medicines do not work, surgery may be done. Surgical options may include:  Dilation and curettage (D&C). In this procedure, your  health care provider opens (dilates) your cervix and then scrapes or suctions tissue from the endometrium to reduce menstrual bleeding.  Operative hysteroscopy. In this procedure, a small tube with a light on the end (hysteroscope) is used to view your uterus and help remove polyps that may be causing heavy periods.  Endometrial ablation. This is when various techniques are used to permanently destroy your entire endometrium. After endometrial ablation, most women have little or no menstrual flow. This procedure reduces your ability to become pregnant.  Endometrial resection. In this procedure, an electrosurgical wire loop is used to remove the endometrium. This procedure reduces your ability to become pregnant.  Hysterectomy. This is surgical removal of the uterus. This is a permanent procedure that stops menstrual periods. Pregnancy is not possible after a hysterectomy. Follow these instructions at home: Medicines  Take over-the-counter and prescription medicines exactly as told by your health care provider. This includes iron pills.  Do not change or switch medicines without asking your health care provider.  Do not take aspirin or medicines that contain aspirin 1 week before or during your menstrual period. Aspirin may make bleeding worse. General instructions  If you need to change your sanitary pad or tampon more than once every 2 hours, limit your activity until the bleeding stops.  Iron pills can cause constipation. To prevent or treat constipation while you are taking prescription iron supplements, your health care provider may recommend that you: ? Drink enough fluid to keep your urine clear or pale yellow. ? Take over-the-counter or prescription medicines. ? Eat foods that are high in fiber, such as fresh fruits and vegetables, whole grains, and beans. ? Limit foods that are high in fat and processed sugars, such as fried and sweet foods.  Eat well-balanced meals, including  foods that are high in iron. Foods that have a lot of iron include leafy green vegetables, meat, liver, eggs, and whole grain breads and cereals.  Do not try to lose weight until the abnormal bleeding has stopped and your blood iron level is back to normal. If you need to lose weight, work with your health care provider to lose weight safely.  Keep all follow-up visits as told by your health care provider. This is important. Contact a health care provider if:  You soak through a pad or tampon every 1 or 2 hours, and this happens every time you have a period.  You need to use pads and tampons at the same time because you are bleeding so much.  You have nausea, vomiting, diarrhea, or other problems related to medicines you are taking. Get help right away if:  You soak through more than a pad or tampon in 1 hour.  You pass clots bigger than 1 inch (2.5 cm) wide.  You feel short  of breath.  You feel like your heart is beating too fast.  You feel dizzy or faint.  You feel very weak or tired. Summary  Menorrhagia is a condition in which menstrual periods are heavy or last longer than normal.  Treatment will depend on the cause of the condition and may include medicines or procedures.  Take over-the-counter and prescription medicines exactly as told by your health care provider. This includes iron pills.  Get help right away if you have heavy bleeding that soaks through more than a pad or tampon in 1 hour, you are passing large clots, or you feel dizzy, faint or short of breath. This information is not intended to replace advice given to you by your health care provider. Make sure you discuss any questions you have with your health care provider. Document Revised: 11/06/2017 Document Reviewed: 07/23/2016 Elsevier Patient Education  [employer].

## 2020-03-16 NOTE — Progress Notes (Signed)
Patient having heaviness feeling with her fibroid about two months ago.

## 2020-07-14 ENCOUNTER — Ambulatory Visit: Payer: Medicaid Other | Admitting: Physical Therapy

## 2020-07-28 ENCOUNTER — Ambulatory Visit: Payer: Medicaid Other | Admitting: Physical Therapy

## 2020-08-22 ENCOUNTER — Ambulatory Visit: Payer: Medicaid Other | Attending: Otolaryngology

## 2020-09-06 ENCOUNTER — Ambulatory Visit: Payer: Medicaid Other | Admitting: Physical Therapy

## 2020-09-19 ENCOUNTER — Ambulatory Visit: Payer: Medicaid Other | Attending: Otolaryngology

## 2020-09-19 ENCOUNTER — Other Ambulatory Visit: Payer: Self-pay

## 2020-09-19 DIAGNOSIS — R42 Dizziness and giddiness: Secondary | ICD-10-CM | POA: Diagnosis present

## 2020-09-19 NOTE — Patient Instructions (Signed)
Habituation Tip Card  1.The goal of habituation training is to assist in decreasing symptoms of vertigo, dizziness, or nausea provoked by specific head and body motions. 2.These exercises may initially increase symptoms; however, be persistent and work through symptoms. With repetition and time, the exercises will assist in reducing or eliminating symptoms. 3.Exercises should be stopped and discussed with the therapist if you experience any of the following: - Sudden change or fluctuation in hearing - New onset of ringing in the ears, or increase in current intensity - Any fluid discharge from the ear - Severe pain in neck or back - Extreme nausea  Copyright  VHI. All rights reserved.  Sit to Side-Lying    Sit on edge of bed. 1. Turn head 45 to right. 2. Maintain head position and lie down slowly on left side. Hold until symptoms subside. 3. Sit up slowly. Hold until symptoms subside. 4. Turn head 45 to left. 5. Maintain head position and lie down slowly on right side. Hold until symptoms subside. 6. Sit up slowly. Repeat sequence 5 times per session. Do 1 sessions per day.  Copyright  VHI. All rights reserved.

## 2020-09-19 NOTE — Therapy (Signed)
[facility] [address] [place], [place], [address] Phone: [phone]   Fax:  [phone]  Physical Therapy Evaluation  Patient Details  Name: Cindy Goodwin MRN: [id number] Date of Birth: [birth year] Referring Provider (PT): Hessie Knows, MD   Encounter Date: 09/19/2020   PT End of Session - 09/19/20 1355    Visit Number 1    Number of Visits 4    Date for PT Re-Evaluation --   re-eval on 4th visit   Authorization Type Medicaid (Waiting Authorization)    PT Start Time 5009    PT Stop Time 1441    PT Time Calculation (min) 45 min    Activity Tolerance Patient tolerated treatment well    Behavior During Therapy [facility] for tasks assessed/performed           Past Medical History:  Diagnosis Date  . Diastrophic dysplasia   . Kyphosis   . Mild persistent asthma 12/31/2017  . Palpitations   . Scoliosis     Past Surgical History:  Procedure Laterality Date  . NO PAST SURGERIES      There were no vitals filed for this visit.    Subjective Assessment - 09/19/20 1400    Subjective Patient reports the vertigo started approx 8 months ago, and tried to ignore it because it was not severe. In the past 6 months she has began to notice it more. Patient reports she notices it laying down and turning over in the bed. She did have the spinning sensation. Reports the last time she had the dizziness was approx 3 days ago, but reports was not severe. Denies nausea sensation. Patient was prescribed Meclizine but has not utilized. Denies changes in vision or hearing.    Pertinent History Diastrophic Dwarfism, Scoliosis, Vertigo    Limitations Other (comment)   Bed Mobility   Patient Stated Goals Find a solution to the vertigo    Currently in Pain? No/denies              [facility] PT Assessment - 09/19/20 0001      Assessment   Medical Diagnosis Vertigo/BPPV    Referring Provider (PT) Hessie Knows, MD    Onset  Date/Surgical Date 06/29/20   referral date   Prior Therapy Occupational Therapy at [facility]      Precautions   Precautions Other (comment)    Precaution Comments Diastrophic Dwarfism, Scoliosis. Potential for Cervical Instability.      Balance Screen   Has the patient fallen in the past 6 months No    Has the patient had a decrease in activity level because of a fear of falling?  No    Is the patient reluctant to leave their home because of a fear of falling?  No      Home Environment   Living Environment Private residence    Type of [place] Access Level entry    Home Layout One level    Home Equipment Wheelchair - power    Additional Comments reports no difficulty getting in/out of apartment. has assistance at home that can help with HEP if needed.      Prior Function   Level of Independence Independent with household mobility with device;Independent with homemaking with wheelchair;Independent with community mobility with device   utilizes power chair for mobility   Vocation Other (comment)    Leisure Artist      Cognition   Overall Cognitive Status Within  Functional Limits for tasks assessed      Sensation   Light Touch Appears Intact    Additional Comments denies sensation changes      Posture/Postural Control   Posture/Postural Control Postural limitations    Postural Limitations Increased thoracic kyphosis;Rounded Shoulders;Forward head      Transfers   Transfers Lateral/Scoot Transfers    Lateral/Scoot Transfers 7: Independent    Comments patient independent with scoot transfer from power chair <> mat               Vestibular Assessment - 09/19/20 0001      Symptom Behavior   Subjective history of current problem See Subjective Information. Denies vision/hearing changes. No Tinnitus.    Type of Dizziness  Vertigo;Spinning    Frequency of Dizziness with specific movements; bed mobility    Duration of Dizziness < 5-10 seconds     Symptom Nature Positional    Aggravating Factors Lying supine;Rolling to right;Rolling to left;Supine to sit    Relieving Factors Closing eyes;Slow movements    Progression of Symptoms Better      Oculomotor Exam   Oculomotor Alignment Normal    Ocular ROM WNL    Spontaneous Absent    Gaze-induced  Absent    Smooth Pursuits Intact    Saccades Intact      Vestibulo-Ocular Reflex   VOR 1 Head Only (x 1 viewing) Normal; reports she felt as dizziness could come on but didnt    VOR to Slow Head Movement Negative right      Other Tests   Comments VBI Screen: Negative      Positional Testing   Dix-Hallpike Dix-Hallpike Right;Dix-Hallpike Left    Sidelying Test Sidelying Right;Sidelying Left    Horizontal Canal Testing Horizontal Canal Right;Horizontal Canal Left      Dix-Hallpike Right   Dix-Hallpike Right Duration None    Dix-Hallpike Right Symptoms No nystagmus      Dix-Hallpike Left   Dix-Hallpike Left Duration None    Dix-Hallpike Left Symptoms No nystagmus      Sidelying Right   Sidelying Right Duration Mild Dizziness, No Spinning (<3 seconds)    Sidelying Right Symptoms No nystagmus      Sidelying Left   Sidelying Left Duration None    Sidelying Left Symptoms No nystagmus      Horizontal Canal Right   Horizontal Canal Right Duration None    Horizontal Canal Right Symptoms Normal      Horizontal Canal Left   Horizontal Canal Left Duration None    Horizontal Canal Left Symptoms Normal      Positional Sensitivities   Sit to Supine No dizziness    Supine to Left Side No dizziness    Supine to Right Side Mild dizziness    Supine to Sitting No dizziness    Right Hallpike No dizziness    Up from Right Hallpike No dizziness    Up from Left Hallpike No dizziness              Objective measurements completed on examination: See above findings.        Vestibular Treatment/Exercise - 09/19/20 0001      Vestibular Treatment/Exercise   Vestibular Treatment  Provided Habituation    Habituation Exercises Dimas Millin   Number of Reps  2    Symptom Description  mild dizziness              PT Education - 09/19/20 1356  Education Details Educated on [employer]. Nestor Lewandowsky Initial HEP (educated on assistance with completion)    Person(s) Educated Patient    Methods Explanation;Demonstration;Handout    Comprehension Verbalized understanding;Returned demonstration            PT Short Term Goals - 09/19/20 1452      PT SHORT TERM GOAL #1   Title = LTGs             PT Long Term Goals - 09/19/20 1453      PT LONG TERM GOAL #1   Title Patient will report independence with Vestibular HEP for self management of symptoms (All LTGs Due: 10/10/20)    Baseline HEP established    Time 3    Period Weeks    Status New    Target Date 10/10/20      PT LONG TERM GOAL #2   Title Patient will report no dizziness with all aspects of bed mobility to report improved symptoms and QoL    Baseline mild dizziness with sit <> R Sidelying    Time 3    Period Weeks    Status New                  Plan - 09/19/20 1447    Clinical Impression Statement Patient is a 35 y.o. female that was referred to Neuro OPPT for BPPV/Vertigo. Patient's PMH is significant for the following: Diastrophic Dwarfism, Scoliosis, Vertigo. Upon evaluation: patient presents with Dizziness, and Increased motion senstivity to certain positional changes. Denied spinning sensation today during session, but did report dizzines with sit <> R sidelying. No nystagmus noted upon positional testing indicating BPPV at this time. Patient does report history of motion senstivity, specifically with car movement. PT educating on use of habituation exercises to reduce motion sensitivity. Patient will benefit from skilled PT services to reduce dizziness, improve QoL, and provide HEP for self management of symptoms.    Personal Factors and  Comorbidities Comorbidity 2;Time since onset of injury/illness/exacerbation    Comorbidities Diastrophic Dwarfism, Scoliosis, Vertigo    Examination-Activity Limitations Bed Mobility    Stability/Clinical Decision Making Stable/Uncomplicated    Clinical Decision Making Low    Rehab Potential Good    PT Frequency 1x / week    PT Duration 3 weeks    PT Treatment/Interventions ADLs/Self Care Home Management;Canalith Repostioning;Cryotherapy;Functional mobility training;Therapeutic activities;Therapeutic exercise;Neuromuscular re-education;Patient/family education;Vestibular;Passive range of motion    PT Next Visit Plan Reassess for BPPV. How was Habituation Exercise? Continue habituation activities.    Consulted and Agree with Plan of Care Patient           Patient will benefit from skilled therapeutic intervention in order to improve the following deficits and impairments:  Dizziness,Decreased activity tolerance,Postural dysfunction  Visit Diagnosis: Dizziness and giddiness     Problem List Patient Active Problem List   Diagnosis Date Noted  . Cough, persistent 12/31/2017  . 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 Mild persistent asthma/cough variant asthma 12/31/2017  . Chronic rhinitis 12/31/2017    Jones Bales, PT, DPT 09/19/2020, 2:55 PM  [facility] [address] [place], [place], [address] Phone: [phone]   Fax:  [phone]  Name: Cindy Goodwin MRN: [id number] Date of Birth: 1986/01/17

## 2020-09-30 ENCOUNTER — Encounter: Payer: Self-pay | Admitting: Obstetrics & Gynecology

## 2020-09-30 ENCOUNTER — Other Ambulatory Visit: Payer: Self-pay

## 2020-09-30 ENCOUNTER — Ambulatory Visit (INDEPENDENT_AMBULATORY_CARE_PROVIDER_SITE_OTHER): Payer: Medicaid Other | Admitting: Obstetrics & Gynecology

## 2020-09-30 VITALS — BP 94/58 | HR 101

## 2020-09-30 DIAGNOSIS — N898 Other specified noninflammatory disorders of vagina: Secondary | ICD-10-CM

## 2020-09-30 DIAGNOSIS — D219 Benign neoplasm of connective and other soft tissue, unspecified: Secondary | ICD-10-CM

## 2020-09-30 NOTE — Progress Notes (Signed)
History:  35 y.o. No obstetric history on file. G0 Pt here today for follow up of fibroids.  Pt reports that her cycles have been well controlled. She is not having significant pain or pressure sx from her fibroids.  She does report that she is having her dryness of the vagina. This is a newer sx for her. She does use scented soap and has not been paying attention to the types of feminine products that she is using (scented or not).  Pt denies having a lesion.      The following portions of the patient's history were reviewed and updated as appropriate: allergies, current medications, past family history, past medical history, past social history, past surgical history and problem list.  Review of Systems:  Pertinent items are noted in HPI.    Objective:  Physical Exam BP (!) 94/58   Pulse (!) 101   CONSTITUTIONAL: Well-developed, well-nourished female in no acute distress.  HENT:  Normocephalic, atraumatic EYES: Conjunctivae and EOM are normal. No scleral icterus.  NECK: Normal range of motion SKIN: Skin is warm and dry. No rash noted. Not diaphoretic.No pallor. [place]: Alert and oriented to person, place, and time. Normal coordination.  Abd: Soft, nontender and nondistended Pelvic: not repeated.    Assessment & Plan:  Uterine fibroids: asymptomatic at present  Vaginal dryness  Will begin with review of all soaps and products. Pt will use only unscented products and will begin using unscented soap.  Will follow for now. If sx persist, will examine pt.   Keylah Darwish L. Harraway-Smith, M.D., [name]

## 2020-10-03 ENCOUNTER — Encounter: Payer: Self-pay | Admitting: Obstetrics & Gynecology

## 2020-10-05 ENCOUNTER — Ambulatory Visit: Payer: Medicaid Other

## 2020-10-12 ENCOUNTER — Ambulatory Visit: Payer: Medicaid Other | Attending: Otolaryngology

## 2020-10-19 ENCOUNTER — Ambulatory Visit: Payer: Medicaid Other

## 2020-11-11 NOTE — Therapy (Signed)
[facility] [address] [place], [place], [address] Phone: [phone]   Fax:  [phone]  Patient Details  Name: Cindy Goodwin MRN: [id number] Date of Birth: Aug 04, 1986 Referring Provider:  No ref. provider found  Encounter Date: 11/11/2020 PHYSICAL THERAPY DISCHARGE SUMMARY  Visits from Start of Care: 1  Current functional level related to goals / functional outcomes: Patient being discharged from PT services due to not returning since last visit.    Remaining deficits: Dizziness   Education / Equipment: Vestibular HEP   Plan: Patient agrees to discharge.  Patient goals were not met. Patient is being discharged due to not returning since the last visit.  ?????         PT Long Term Goals - 09/19/20 1453      PT LONG TERM GOAL #1   Title Patient will report independence with Vestibular HEP for self management of symptoms (All LTGs Due: 10/10/20)    Baseline HEP established    Time 3    Period Weeks    Status New    Target Date 10/10/20      PT LONG TERM GOAL #2   Title Patient will report no dizziness with all aspects of bed mobility to report improved symptoms and QoL    Baseline mild dizziness with sit <> R Sidelying    Time 3    Period Weeks    Status New              Jones Bales, PT, DPT 11/11/2020, 12:57 PM  [facility] [address] [place], [place], [address] Phone: [phone]   Fax:  [phone]

## 2021-03-22 ENCOUNTER — Encounter: Payer: Self-pay | Admitting: Obstetrics & Gynecology

## 2021-03-22 ENCOUNTER — Ambulatory Visit (INDEPENDENT_AMBULATORY_CARE_PROVIDER_SITE_OTHER): Payer: Medicaid Other | Admitting: Obstetrics & Gynecology

## 2021-03-22 ENCOUNTER — Other Ambulatory Visit: Payer: Self-pay

## 2021-03-22 VITALS — BP 115/51 | HR 117 | Wt 74.0 lb

## 2021-03-22 DIAGNOSIS — D219 Benign neoplasm of connective and other soft tissue, unspecified: Secondary | ICD-10-CM

## 2021-03-22 NOTE — Progress Notes (Signed)
History:  35 y.o. G0 Pt is here to discuss management of uterine fibroids. Pt reports that her cycles are not excessively heavy but, the fibroids are seemingly large and she reports pressure sx.  She is here with her friend to discuss options. Pt reports that she does not have plans to conceive now or in the future. She has never had surgery and has some angst about that.  Pt reports that she does ambulate at home without her electric chair.   The following portions of the patient's history were reviewed and updated as appropriate: allergies, current medications, past family history, past medical history, past social history, past surgical history and problem list.  Review of Systems:  Pertinent items are noted in HPI.    Objective:  Physical Exam Blood pressure (!) 115/51, pulse (!) 117, weight 74 lb (33.6 kg), last menstrual period 03/12/2021. The exam was conducted in pts electric chair.   CONSTITUTIONAL: Well-developed, well-nourished small person in no acute distress.  HENT:  Normocephalic, atraumatic EYES: Conjunctivae and EOM are normal. No scleral icterus.  NECK: Normal range of motion SKIN: Skin is warm and dry. No rash noted. Not diaphoretic.No pallor. [place]: Alert and oriented to person, place, and time. Normal coordination.  Abd: Soft, nontender and nondistended. The uterus is palpable above the umbilicus.   Pelvic: not repeated.   Labs and Imaging No results found.  Assessment & Plan:  Cindy Goodwin was seen today for follow-up.  Diagnoses and all orders for this visit:  Fibroids -     US Transvaginal Non-OB; Future -     US Pelvis Complete; Future  I had an extensive conversation with pt and her friend about surgical options including conservative: Sonata, UFE and meds and surgical myomectomy vs hysterectomy and we discussed different methods of hysterectomy. We need imaging to see the actual size of the fibroids. I am not sure if she will be a candidate for robotic  surgery given the size of the fibroids but, imaging may help. Either was a anesthesia consult or chart review would be needed prior to surgery given her size.   The risks and alternative were reviewed with them and all of their questions were answered. She would like to have the imiging and then follow up for further discussion in 3-6 months. I have explained to her that this is not urgent surgery.   Total face-to-face time with patient, review of chart, discussion with consultant and coordination of care was 35-46mn.      Dicky Boer L. Harraway-Smith, M.D., FCherlynn June

## 2021-03-28 ENCOUNTER — Encounter (HOSPITAL_BASED_OUTPATIENT_CLINIC_OR_DEPARTMENT_OTHER): Payer: Self-pay

## 2021-03-28 ENCOUNTER — Ambulatory Visit (HOSPITAL_BASED_OUTPATIENT_CLINIC_OR_DEPARTMENT_OTHER)
Admission: RE | Admit: 2021-03-28 | Discharge: 2021-03-28 | Disposition: A | Discharge status: Home / Self Care | Payer: Medicaid Other | Source: Ambulatory Visit | Attending: Obstetrics & Gynecology | Admitting: Obstetrics & Gynecology

## 2021-03-28 ENCOUNTER — Other Ambulatory Visit: Payer: Self-pay | Admitting: Obstetrics & Gynecology

## 2021-03-28 ENCOUNTER — Other Ambulatory Visit: Payer: Self-pay

## 2021-03-28 ENCOUNTER — Encounter: Payer: Self-pay | Admitting: Obstetrics & Gynecology

## 2021-03-28 DIAGNOSIS — D219 Benign neoplasm of connective and other soft tissue, unspecified: Secondary | ICD-10-CM | POA: Insufficient documentation

## 2021-03-28 NOTE — Addendum Note (Signed)
Addended by: Lavonia Drafts on: 03/28/2021 11:11 AM   Modules accepted: Level of Service

## 2021-03-29 ENCOUNTER — Telehealth: Payer: Self-pay

## 2021-03-29 NOTE — Telephone Encounter (Signed)
Patient called and made aware that after reviewing her ultrasound her procedure would have to be and open procedure. Dr. Ihor Dow would not be able to remove the fibroid vaginally. Patient states understanding.   Patient will call back next month to schedule a three month follow up. Kathrene Alu RN

## 2021-05-01 ENCOUNTER — Telehealth: Payer: Self-pay | Admitting: General Practice

## 2021-05-01 NOTE — Telephone Encounter (Signed)
Left message on VM for patient to contact our office to schedule 3 month follow up in November with Dr. Ihor Dow.

## 2021-06-21 ENCOUNTER — Ambulatory Visit: Payer: Medicaid Other | Admitting: Obstetrics & Gynecology

## 2021-07-12 ENCOUNTER — Ambulatory Visit (INDEPENDENT_AMBULATORY_CARE_PROVIDER_SITE_OTHER): Payer: Medicaid Other | Admitting: Obstetrics & Gynecology

## 2021-07-12 ENCOUNTER — Encounter: Payer: Self-pay | Admitting: Obstetrics & Gynecology

## 2021-07-12 ENCOUNTER — Other Ambulatory Visit: Payer: Self-pay

## 2021-07-12 VITALS — BP 95/59 | HR 122

## 2021-07-12 DIAGNOSIS — D251 Intramural leiomyoma of uterus: Secondary | ICD-10-CM | POA: Diagnosis not present

## 2021-07-12 DIAGNOSIS — D252 Subserosal leiomyoma of uterus: Secondary | ICD-10-CM

## 2021-07-12 NOTE — Progress Notes (Signed)
History:  35 y.o. G0P0000 here today for f/u uterine fibroids. Pt requests definitive management of large fibroids with a hysterectomy. Pt has been anxious about surgery since her last visit and [place] but, wants to proceed with the case. Her sx are unchanged. Heavy menses and pressure sx.   The following portions of the patient's history were reviewed and updated as appropriate: allergies, current medications, past family history, past medical history, past social history, past surgical history and problem list.  Review of Systems:  Pertinent items are noted in HPI.    Objective:  Physical Exam Blood pressure (!) 95/59, pulse (!) 122, last menstrual period 06/22/2021.  CONSTITUTIONAL: Well-developed, well-nourished female in no acute distress.  HENT:  Normocephalic, atraumatic EYES: Conjunctivae and EOM are normal. No scleral icterus.  NECK: Normal range of motion SKIN: Skin is warm and dry. No rash noted. Not diaphoretic.No pallor. [place]: Alert and oriented to person, place, and time. Normal coordination.    Labs and Imaging 03/28/2021 CLINICAL DATA:  Uterine fibroids, history of diastrophic dysplasia   EXAM: TRANSABDOMINAL ULTRASOUND OF PELVIS   TECHNIQUE: Transabdominal ultrasound examination of the pelvis was performed including evaluation of the uterus, ovaries, adnexal regions, and pelvic cul-de-sac. Transvaginal imaging was not performed.   COMPARISON:  None   FINDINGS: Uterus   Measurements: 10.6 x 3.7 x 6.8 cm = volume: 142 mL. Anteverted. Exophytic leiomyoma at fundus 2.1 x 2.3 x 2.0 cm. Additional large anterior LEFT leiomyoma exophytic 13.3 x 9.8 x 12.9 cm. Third exophytic leiomyoma anterior slightly LEFT uterus 6.9 x 5.6 x 5.4 cm.   Endometrium   Thickness: 8 mm. Small amount of nonspecific endometrial fluid. No focal mass.   Right ovary   Measurements: 1.5 x 1.4 x 1.3 cm = volume: 1.3 mL. Normal morphology without mass   Left ovary   Obscured due  to presence of bowel loops and exophytic LEFT-sided leiomyomata.   Other findings:  No free pelvic fluid or additional pelvic masses.   IMPRESSION: Nonvisualization of LEFT ovary.   Three exophytic leiomyomata, largest 13.3 cm in greatest size extending LEFT.  Assessment & Plan:  Large uterine fibroids.   Patient desires surgical management with Total abd hysterectomy with bilateral salpingectomy. The risks of surgery were discussed in detail with the patient including but not limited to: bleeding which may require transfusion or reoperation; infection which may require prolonged hospitalization or re-hospitalization and antibiotic therapy; injury to bowel, bladder, ureters and major vessels or other surrounding organs; need for additional procedures including laparotomy; thromboembolic phenomenon, incisional problems and other postoperative or anesthesia complications.  Patient was told that the likelihood that her condition and symptoms will be treated effectively with this surgical management was very high; the postoperative expectations were also discussed in detail. The patient also understands the alternative treatment options which were discussed in full. All questions were answered.  She was told that she will be contacted by our surgical scheduler regarding the time and date of her surgery; routine preoperative instructions of having nothing to eat or drink after midnight on the day prior to surgery and also coming to the hospital 1 1/2 hours prior to her time of surgery were also emphasized.  She was told she may be called for a preoperative appointment about a week prior to surgery and will be given further preoperative instructions at that visit. Printed patient education handouts about the procedure were given to the patient to review at home.   Total face-to-face time with patient, review  of chart, discussion with consultant and coordination of care was 7min.    Yecheskel Kurek L.  Harraway-Smith, M.D., [name]

## 2021-07-12 NOTE — Patient Instructions (Signed)
Abdominal Hysterectomy Abdominal hysterectomy is a surgical procedure to remove the uterus. The uterus is an organ that holds the baby as it develops before birth. This surgery may be done if a woman has certain problems of the uterus. These may include cancer or growths (tumors or fibroids). Other problems include infection, chronic pain, severe bleeding, or other problems of the menstrual cycle. The procedure may also be done if: The uterus has slipped down into the vagina (uterine prolapse). The tissue that lines the uterus is growing outside of its normal location (endometriosis). Depending on the reason for this procedure, other reproductive organs may also be removed. These could include: The lowest part of the uterus (cervix), which opens into the vagina. The organs that make eggs (ovaries). The tubes that connect the ovaries to the uterus (fallopian tubes). Tell your health care provider about: Any allergies you have. All medicines you are taking, including vitamins, herbs, eye drops, creams, and over-the-counter medicines. Any problems you or family members have had with anesthetic medicines. Any blood disorders you have. Any surgeries you have had. Any medical conditions you have or have had. Whether you are pregnant or may be pregnant. What are the risks? Generally, this is a safe procedure. However, problems may occur, including: Bleeding. Infection. Allergic reactions to medicines or dyes. Damage to nearby structures or organs. Nerve injury. Decreased interest in sex or pain during sex. Blood clots that can break free and travel to your lungs. What happens before the procedure? Staying hydrated Follow instructions from your health care provider about hydration, which may include: Up to 2 hours before the procedure - you may continue to drink clear liquids, such as water, clear fruit juice, black coffee, and plain tea. Eating and drinking restrictions Follow instructions  from your health care provider about eating and drinking, which may include: 8 hours before the procedure - stop eating heavy meals or foods, such as meat, fried foods, or fatty foods. 6 hours before the procedure - stop eating light meals or foods, such as toast or cereal. 6 hours before the procedure - stop drinking milk or drinks that contain milk. 2 hours before the procedure - stop drinking clear liquids. Medicines Ask your health care provider about: Changing or stopping your regular medicines. This is especially important if you are taking diabetes medicines or blood thinners. Taking medicines such as aspirin and ibuprofen. These medicines can thin your blood. Do not take these medicines unless your health care provider tells you to take them. Taking over-the-counter medicines, vitamins, herbs, and supplements. You may be asked to take a medicine to empty your colon (bowel preparation). General instructions This procedure can affect the way you feel about yourself. Talk with your health care provider about the physical and emotional changes this procedure may cause. Do not use any products that contain nicotine or tobacco for at least 4 weeks before the procedure. These products include cigarettes, chewing tobacco, and vaping devices, such as e-cigarettes. If you need help quitting, ask your health care provider. Do not drink beverages that contain alcohol prior to the procedure. Alcohol can increase your risk of bleeding and complications. If you need help stopping, ask your health care provider. Plan to have a responsible adult take you home from the hospital or clinic. Ask your health care provider: How your surgery site will be marked. What steps will be taken to help prevent infection. These steps may include: Removing hair at the surgery site. Washing skin with a germ-killing  soap. Taking antibiotic medicine. What happens during the procedure? An IV will be inserted into one of  your veins. You will be given one or more of the following: A medicine to help you relax (sedative). A medicine to make you fall asleep (general anesthetic). A medicine that is injected into your spine to numb the area below and slightly above the injection site (spinal anesthetic). A medicine that is injected into an area of your body to numb everything below the injection site (regional anesthetic). Compression stockings will be placed on your legs to promote circulation. A thin, flexible tube (catheter) will be inserted to help drain your urine. An incision will be made through the skin in your lower abdomen. The incision may go side to side or up and down. Your uterus and any other organs that need to be removed will be carefully taken out. Bleeding will be controlled with clamps or stitches (sutures). Your incision will be closed with sutures, skin glue, or adhesive strips. A bandage (dressing) will be placed over the incision. The procedure may vary among health care providers and hospitals. What happens after the procedure? Your blood pressure, heart rate, breathing rate, and blood oxygen level will be monitored until you leave the hospital or clinic. You will be given pain medicine as needed. Ask your health care provider how long you will need to stay in the hospital after your procedure. You may have a liquid diet at first. You will most likely return to your usual diet the day after surgery. You will still have the urinary catheter in place. It will likely be removed the day after surgery. You may have to wear compression stockings. These stockings help to prevent blood clots and reduce swelling in your legs. You will be encouraged to walk as soon as possible. You will do breathing exercises or use a device to help keep your lungs clear. You may need to use a sanitary napkin for discharge from the vagina. Summary Abdominal hysterectomy is a surgical procedure to remove the uterus.  The uterus is the organ that holds a developing baby. This procedure can affect the way you feel about yourself. Talk with your health care provider about the physical and emotional changes this procedure may cause. You will be given pain medicine after the procedure. Ask your health care provider how long you will need to stay in the hospital after your procedure. This information is not intended to replace advice given to you by your health care provider. Make sure you discuss any questions you have with your health care provider. Document Revised: 03/31/2020 Document Reviewed: 03/31/2020 Elsevier Patient Education  [place]. Abdominal Hysterectomy, Care After The following information offers guidance on how to care for yourself after your procedure. Your health care provider may also give you more specific instructions. If you have problems or questions, contact your health care provider. What can I expect after the procedure? After the procedure, it is common to have: Pain. Tiredness (fatigue). Poor appetite. Less interest in sex. Vaginal bleeding and discharge. You may need to use a sanitary napkin after this procedure. Constipation. Feelings of sadness or other emotions. Follow these instructions at home: Medicines Take over-the-counter and prescription medicines only as told by your health care provider. If you were prescribed an antibiotic medicine, take it as told by your health care provider. Do not stop using the antibiotic even if you start to feel better. Ask your health care provider if the medicine prescribed to  you: Requires you to avoid driving or using machinery. Can cause constipation. You may need to take these actions to prevent or treat constipation: Drink enough fluid to keep your urine pale yellow. Take over-the-counter or prescription medicines. Eat foods that are high in fiber, such as beans, whole grains, and fresh fruits and vegetables. Limit foods  that are high in fat and processed sugars, such as fried or sweet foods. Incision care  Follow instructions from your health care provider about how to take care of your incision. Make sure you: Wash your hands with soap and water for at least 20 seconds before and after you change your bandage (dressing). If soap and water are not available, use hand sanitizer. Change your dressing as told by your health care provider. Leave stitches (sutures), skin glue, or adhesive strips in place. These skin closures may need to stay in place for 2 weeks or longer. If adhesive strip edges start to loosen and curl up, you may trim the loose edges. Do not remove adhesive strips completely unless your health care provider tells you to do that. Keep the dressing dry until your health care provider says it can be removed. Check your incision area every day for signs of infection. Check for: More redness, swelling, or pain. Fluid or blood. Warmth. Pus or a bad smell. Activity  Rest as told by your health care provider. Avoid sitting for a long time without moving. Get up to take short walks every 1-2 hours. This is important to improve blood flow and breathing. Ask for help if you feel weak or unsteady. Do not lift anything that is heavier than 10 lb (4.5 kg), or the limit that you are told, until your health care provider says that it is safe. Follow instructions from your health care provider about exercise, driving, and general activities. Return to your normal activities as told by your health care provider. Ask your health care provider what activities are safe for you. Lifestyle Do not douche, use tampons, or have sex for at least 6 weeks or as told by your health care provider. Do not drink alcohol until your health care provider approves. Do not use any products that contain nicotine or tobacco. These products include cigarettes, chewing tobacco, and vaping devices, such as e-cigarettes. These can delay  healing after surgery. If you need help quitting, ask your health care provider. General instructions If you struggle with physical or emotional changes after your procedure, speak with your health care provider or a therapist. Do not take baths, swim, or use a hot tub until your health care provider approves. Ask your health care provider if you may take showers. You may only be allowed to take sponge baths. Try to have a responsible adult at home with you for the first 1-2 weeks to help with your daily chores. Wear compression stockings as told by your health care provider. These stockings help to prevent blood clots and reduce swelling in your legs. Keep all follow-up visits. This is important. Contact a health care provider if: You have any of these signs of infection: Chills or a fever. More redness, swelling, warmth, or pain around your incision. Fluid or blood coming from your incision. Pus or a bad smell coming from your incision. Your incision opens. You feel dizzy or light-headed. You have pain or bleeding when you urinate. You have diarrhea that does not go away. You have nausea and vomiting that do not go away. You have pus or a  bad-smelling discharge coming from your vagina. You have any type of abnormal reaction such as a rash, or you develop an allergy to your medicine. Your pain medicine does not help. Get help right away if: You have a fever and your symptoms suddenly get worse. You have severe pain in your abdomen. You have shortness of breath. You faint. You have pain, swelling, or redness in your leg. You have heavy vaginal bleeding and blood clots, soaking through a sanitary napkin in less than 1 hour. These symptoms may represent a serious problem that is an emergency. Do not wait to see if the symptoms will go away. Get medical help right away. Call your local emergency services (911 in the [place] Do not drive yourself to the hospital. Summary After your  procedure, it is common to have pain, tiredness, and vaginal discharge. Do not lift anything that is heavier than 10 lb (4.5 kg), or the limit that you are told, until your health care provider says that it is safe. Follow instructions from your health care provider about exercise, driving, and general activities. Ask what activities are safe for you. Do not take baths, swim, or use a hot tub until your health care provider approves. Ask your health care provider if you may take showers. You may only be allowed to take sponge baths. Try to have a responsible adult at home with you for the first 1-2 weeks to help with your daily chores. This information is not intended to replace advice given to you by your health care provider. Make sure you discuss any questions you have with your health care provider. Document Revised: 09/13/2020 Document Reviewed: 03/31/2020 Elsevier Patient Education  2022 [employer].

## 2021-07-28 ENCOUNTER — Telehealth: Payer: Self-pay | Admitting: General Practice

## 2021-07-28 NOTE — Telephone Encounter (Signed)
Left message on VM informing patient of pre op appointment with Dr. Ihor Dow on 10/04/2021 at 1:10pm.  Pt was asked to contact our office with any questions or concerns.

## 2021-10-04 ENCOUNTER — Encounter: Payer: Self-pay | Admitting: Obstetrics & Gynecology

## 2021-10-04 ENCOUNTER — Other Ambulatory Visit: Payer: Self-pay

## 2021-10-04 ENCOUNTER — Ambulatory Visit (INDEPENDENT_AMBULATORY_CARE_PROVIDER_SITE_OTHER): Payer: Medicaid Other | Admitting: Obstetrics & Gynecology

## 2021-10-04 VITALS — BP 118/72 | HR 117

## 2021-10-04 DIAGNOSIS — N920 Excessive and frequent menstruation with regular cycle: Secondary | ICD-10-CM

## 2021-10-04 DIAGNOSIS — D251 Intramural leiomyoma of uterus: Secondary | ICD-10-CM | POA: Diagnosis not present

## 2021-10-04 NOTE — Progress Notes (Signed)
History:  36 y.o. G0P0000 here today for pre op prior to TAH with bilateral salpingectomy. Pt denies new problems.  Pt uses electric wheelchair when out. She reports that she is able to walk at home. It is harder on hardwood floors but, she can walk with ease on carpet. She does sometimes use a walker with walking longer distances.    The following portions of the patient's history were reviewed and updated as appropriate: allergies, current medications, past family history, past medical history, past social history, past surgical history and problem list.  Review of Systems:  Pertinent items are noted in HPI.    Objective:  Physical Exam Blood pressure 118/72, pulse (!) 117.  CONSTITUTIONAL: Well-developed, well-nourished female in no acute distress.  HENT:  Normocephalic, atraumatic EYES: Conjunctivae and EOM are normal. No scleral icterus.  NECK: Normal range of motion SKIN: Skin is warm and dry. No rash noted. Not diaphoretic.No pallor. [place]: Alert and oriented to person, place, and time. Normal coordination.   Abd: Soft, nontender and nondistended Pelvic: Normal appearing external genitalia; normal appearing vaginal mucosa and cervix.  Normal discharge.  Small uterus, no other palpable masses, no uterine or adnexal tenderness  Labs and Imaging 03/28/2021 CLINICAL DATA:  Uterine fibroids, history of diastrophic dysplasia   EXAM: TRANSABDOMINAL ULTRASOUND OF PELVIS   TECHNIQUE: Transabdominal ultrasound examination of the pelvis was performed including evaluation of the uterus, ovaries, adnexal regions, and pelvic cul-de-sac. Transvaginal imaging was not performed.   COMPARISON:  None   FINDINGS: Uterus   Measurements: 10.6 x 3.7 x 6.8 cm = volume: 142 mL. Anteverted. Exophytic leiomyoma at fundus 2.1 x 2.3 x 2.0 cm. Additional large anterior LEFT leiomyoma exophytic 13.3 x 9.8 x 12.9 cm. Third exophytic leiomyoma anterior slightly LEFT uterus 6.9 x 5.6 x 5.4 cm.    Endometrium   Thickness: 8 mm. Small amount of nonspecific endometrial fluid. No focal mass.   Right ovary   Measurements: 1.5 x 1.4 x 1.3 cm = volume: 1.3 mL. Normal morphology without mass   Left ovary   Obscured due to presence of bowel loops and exophytic LEFT-sided leiomyomata.   Other findings:  No free pelvic fluid or additional pelvic masses.   IMPRESSION: Nonvisualization of LEFT ovary.   Three exophytic leiomyomata, largest 13.3 cm in greatest size extending LEFT.    Assessment & Plan:  Sx uterine fibroids  Patient desires surgical management with TAH with bilateral salpingectomy.  The risks of surgery were discussed in detail with the patient including but not limited to: bleeding which may require transfusion or reoperation; infection which may require prolonged hospitalization or re-hospitalization and antibiotic therapy; injury to bowel, bladder, ureters and major vessels or other surrounding organs; need for additional procedures including laparotomy; thromboembolic phenomenon, incisional problems and other postoperative or anesthesia complications.  Patient was told that the likelihood that her condition and symptoms will be treated effectively with this surgical management was very high; the postoperative expectations were also discussed in detail. The patient also understands the alternative treatment options which were discussed in full. All questions were answered.  She was told that she will be contacted by our surgical scheduler regarding the time and date of her surgery; routine preoperative instructions of having nothing to eat or drink after midnight on the day prior to surgery and also coming to the hospital 1 1/2 hours prior to her time of surgery were also emphasized.  She was told she may be called for a preoperative appointment about  a week prior to surgery and will be given further preoperative instructions at that visit. Printed patient education handouts  about the procedure were given to the patient to review at home.  Pt accompanied by her fried who will be present at the time of the surgery. Pt instructed to bring her walker with her to the hospital. I have prev discussed this pt with anesthesia. They have no identified concerns.   Total face-to-face time with patient, review of chart, discussion with consultant and coordination of care was 55min.    Karol Liendo L. Harraway-Smith, M.D., [name]

## 2021-10-10 NOTE — Progress Notes (Signed)
Surgical Instructions ? ? ? Your procedure is scheduled on Tuesday, March 7th, 2023. ? ? Report to [facility] Main Entrance "A" at 05:30 A.M., then check in with the Admitting office. ? Call this number if you have problems the morning of surgery: ? [phone] ? ? If you have any questions prior to your surgery date call [phone]: Open Monday-Friday 8am-4pm ? ? ? Remember: ? Do not eat after midnight the night before your surgery ? ?You may drink clear liquids until 04:30 the morning of your surgery.   ?Clear liquids allowed are: Water, Non-Citrus Juices (without pulp), Carbonated Beverages, Clear Tea, Black Coffee ONLY (NO MILK, CREAM OR POWDERED CREAMER of any kind), and Gatorade ?  ? Take these medicines the morning of surgery with A SIP OF WATER:  ? ?loratadine (CLARITIN) - as needed ?albuterol (PROVENTIL HFA;VENTOLIN HFA) - as needed - please, bring the inhaler with you the day of surgery ? ?As of today, STOP taking any Aspirin (unless otherwise instructed by your surgeon) Aleve, Naproxen, Ibuprofen, Motrin, Advil, Goody's, BC's, all herbal medications, fish oil, and all vitamins. ? ? ?The day of surgery: ?         ?Do not wear jewelry or makeup ?Do not wear lotions, powders, perfumes, or deodorant. ?Do not shave 48 hours prior to surgery.   ?Do not bring valuables to the hospital. ?Do not wear nail polish, gel polish, artificial nails, or any other type of covering on natural nails (fingers and toes) ?If you have artificial nails or gel coating that need to be removed by a nail salon, please have this removed prior to surgery. Artificial nails or gel coating may interfere with anesthesia's ability to adequately monitor your vital signs. ? ? ?[facility] is not responsible for any belongings or valuables. .  ? ?Do NOT Smoke (Tobacco/Vaping)  24 hours prior to your procedure ? ?If you use a CPAP at night, you may bring your mask for your overnight stay. ?  ?Contacts, glasses, hearing aids, dentures or  partials may not be worn into surgery, please bring cases for these belongings ?  ?For patients admitted to the hospital, discharge time will be determined by your treatment team. ?  ?Patients discharged the day of surgery will not be allowed to drive home, and someone needs to stay with them for 24 hours. ? ?NO VISITORS WILL BE ALLOWED IN PRE-OP WHERE PATIENTS ARE PREPPED FOR SURGERY.  ONLY 1 SUPPORT PERSON MAY BE PRESENT IN THE WAITING ROOM WHILE YOU ARE IN SURGERY.  IF YOU ARE TO BE ADMITTED, ONCE YOU ARE IN YOUR ROOM YOU WILL BE ALLOWED TWO (2) VISITORS. 1 (ONE) VISITOR MAY STAY OVERNIGHT BUT MUST ARRIVE TO THE ROOM BY 8pm.  Minor children may have two parents present. Special consideration for safety and communication needs will be reviewed on a case by case basis. ? ?Special instructions:   ? ?Oral Hygiene is also important to reduce your risk of infection.  Remember - BRUSH YOUR TEETH THE MORNING OF SURGERY WITH YOUR REGULAR TOOTHPASTE ? ? ?[facility]- Preparing For Surgery ? ?Before surgery, you can play an important role. Because skin is not sterile, your skin needs to be as free of germs as possible. You can reduce the number of germs on your skin by washing with CHG (chlorahexidine gluconate) Soap before surgery.  CHG is an antiseptic cleaner which kills germs and bonds with the skin to continue killing germs even after washing.   ? ? ?  Please do not use if you have an allergy to CHG or antibacterial soaps. If your skin becomes reddened/irritated stop using the CHG.  ?Do not shave (including legs and underarms) for at least 48 hours prior to first CHG shower. It is OK to shave your face. ? ?Please follow these instructions carefully. ?  ? ? Shower the NIGHT BEFORE SURGERY and the MORNING OF SURGERY with CHG Soap.  ? If you chose to wash your hair, wash your hair first as usual with your normal shampoo. After you shampoo, rinse your hair and body thoroughly to remove the shampoo.  Then [employer] and  genitals (private parts) with your normal soap and rinse thoroughly to remove soap. ? ?After that Use CHG Soap as you would any other liquid soap. You can apply CHG directly to the skin and wash gently with a scrungie or a clean washcloth.  ? ?Apply the CHG Soap to your body ONLY FROM THE NECK DOWN.  Do not use on open wounds or open sores. Avoid contact with your eyes, ears, mouth and genitals (private parts). Wash Face and genitals (private parts)  with your normal soap.  ? ?Wash thoroughly, paying special attention to the area where your surgery will be performed. ? ?Thoroughly rinse your body with warm water from the neck down. ? ?DO NOT shower/wash with your normal soap after using and rinsing off the CHG Soap. ? ?Pat yourself dry with a CLEAN TOWEL. ? ?Wear CLEAN PAJAMAS to bed the night before surgery ? ?Place CLEAN SHEETS on your bed the night before your surgery ? ?DO NOT SLEEP WITH PETS. ? ? ?Day of Surgery: ? ?Take a shower with CHG soap. ?Wear Clean/Comfortable clothing the morning of surgery ?Do not apply any deodorants/lotions.   ?Remember to brush your teeth WITH YOUR REGULAR TOOTHPASTE. ? ? ? ?COVID testing ? ?If you are going to stay overnight or be admitted after your procedure/surgery and require a pre-op COVID test, please follow these instructions after your COVID test  ? ?You are not required to quarantine however you are required to wear a well-fitting mask when you are out and around people not in your household.  If your mask becomes wet or soiled, replace with a new one. ? ?Wash your hands often with soap and water for 20 seconds or clean your hands with an alcohol-based hand sanitizer that contains at least 60% alcohol. ? ?Do not share personal items. ? ?Notify your provider: ?if you are in close contact with someone who has COVID  ?or if you develop a fever of 100.4 or greater, sneezing, cough, sore throat, shortness of breath or body aches. ? ?  ?Please read over the following fact  sheets that you were given.   ?

## 2021-10-11 ENCOUNTER — Other Ambulatory Visit: Payer: Self-pay

## 2021-10-11 ENCOUNTER — Encounter (HOSPITAL_COMMUNITY)
Admission: RE | Admit: 2021-10-11 | Discharge: 2021-10-11 | Disposition: A | Discharge status: Home / Self Care | Payer: Medicaid Other | Source: Ambulatory Visit | Attending: Obstetrics & Gynecology | Admitting: Obstetrics & Gynecology

## 2021-10-11 ENCOUNTER — Encounter (HOSPITAL_COMMUNITY): Payer: Self-pay

## 2021-10-11 VITALS — BP 112/76 | HR 119 | Temp 98.3°F | Resp 18 | Ht <= 58 in | Wt 77.0 lb

## 2021-10-11 DIAGNOSIS — J4530 Mild persistent asthma, uncomplicated: Secondary | ICD-10-CM | POA: Insufficient documentation

## 2021-10-11 DIAGNOSIS — R0683 Snoring: Secondary | ICD-10-CM | POA: Diagnosis not present

## 2021-10-11 DIAGNOSIS — M419 Scoliosis, unspecified: Secondary | ICD-10-CM | POA: Diagnosis not present

## 2021-10-11 DIAGNOSIS — Z01818 Encounter for other preprocedural examination: Secondary | ICD-10-CM | POA: Diagnosis present

## 2021-10-11 DIAGNOSIS — J45991 Cough variant asthma: Secondary | ICD-10-CM | POA: Insufficient documentation

## 2021-10-11 DIAGNOSIS — D6489 Other specified anemias: Secondary | ICD-10-CM | POA: Insufficient documentation

## 2021-10-11 DIAGNOSIS — R000 Tachycardia, unspecified: Secondary | ICD-10-CM | POA: Diagnosis not present

## 2021-10-11 DIAGNOSIS — Q775 Diastrophic dysplasia: Secondary | ICD-10-CM | POA: Insufficient documentation

## 2021-10-11 DIAGNOSIS — D219 Benign neoplasm of connective and other soft tissue, unspecified: Secondary | ICD-10-CM | POA: Diagnosis not present

## 2021-10-11 LAB — CBC
HCT: 31 % — ABNORMAL LOW (ref 36.0–46.0)
Hemoglobin: 9.7 g/dL — ABNORMAL LOW (ref 12.0–15.0)
MCH: 23.8 pg — ABNORMAL LOW (ref 26.0–34.0)
MCHC: 31.3 g/dL (ref 30.0–36.0)
MCV: 76 fL — ABNORMAL LOW (ref 80.0–100.0)
Platelets: 374 10*3/uL (ref 150–400)
RBC: 4.08 MIL/uL (ref 3.87–5.11)
RDW: 21.1 % — ABNORMAL HIGH (ref 11.5–15.5)
WBC: 4.2 10*3/uL (ref 4.0–10.5)
nRBC: 0 % (ref 0.0–0.2)

## 2021-10-11 LAB — TYPE AND SCREEN
ABO/RH(D): O POS
Antibody Screen: NEGATIVE

## 2021-10-11 LAB — BASIC METABOLIC PANEL
Anion gap: 9 (ref 5–15)
BUN: 7 mg/dL (ref 6–20)
CO2: 24 mmol/L (ref 22–32)
Calcium: 9 mg/dL (ref 8.9–10.3)
Chloride: 104 mmol/L (ref 98–111)
Creatinine, Ser: <0.3 mg/dL — ABNORMAL LOW (ref 0.44–1.00)
Glucose, Bld: 77 mg/dL (ref 70–99)
Potassium: 4 mmol/L (ref 3.5–5.1)
Sodium: 137 mmol/L (ref 135–145)

## 2021-10-11 NOTE — Progress Notes (Addendum)
PCP - Nolene Ebbs MD ?Cardiologist - denies ? ?PPM/ICD - denies ?Device Orders -  ?Rep Notified -  ? ?Chest x-ray - no ?EKG - 10/11/21 ?Stress Test - no ?ECHO - no ?Cardiac Cath - no ? ?Sleep Study -  ?CPAP -  ? ?Fasting Blood Sugar - n/a ?Checks Blood Sugar _____ times a day ? ?Blood Thinner Instructions:n/a ?Aspirin Instructions:n/a ? ?ERAS Protcol -clear liquids until 4:30am  ?PRE-SURGERY Ensure or G2- no  ? ?COVID TEST- Scheduled for Friday March 3.  ? ? ?Anesthesia review: yes-pt has features concerning for potential difficult airway-kyphosis,scolios,diastrophic dysplasia ? ?Patient denies shortness of breath, fever, cough and chest pain at PAT appointment ? ? ?All instructions explained to the patient, with a verbal understanding of the material. Patient agrees to go over the instructions while at home for a better understanding. Patient also instructed to self quarantine after being tested for COVID-19. The opportunity to ask questions was provided. ?  ?

## 2021-10-12 NOTE — Progress Notes (Signed)
Anesthesia Chart Review: ? ?Evaluated patient at preadmission testing appointment due to history of diastrophic dysplasia and concern for difficult intubation. Diastrophic dysplasia is a disorder of cartilage and bone development and short stature with very short limbs. She also has associated kyphosis and scoliosis.   Her mobility is limited and she uses a motorized wheelchair when out.  Normal cognitive function, graduated from [employer] in 2012.  She has never had surgery or general anesthesia previously.  Review of available literature indicates that if tracheal intubation is required, excessive cervical manipulation is to be avoided due to potential for cervical instability.  Patient is unaware of this ever being discussed, denies any history of neck pain.  She reports some limited cervical ROM, however on exam her cervical extension is reasonably good and she has a normal oral opening.  She denies any history of pulmonary disease, she does have history of mild persistent asthma/cough variant asthma.  Spirometry 2019 showed normal ventilatory function.  She has no diagnosis of sleep apnea, however she does report significant snoring which gives me high concern for untreated OSA.  Patients with diastrophic dysplasia are also at risk for laryngotracheobronchomalacia.  Her BMI is calculated at 74, however I do not feel this accurately represents her body habitus.  She is not morbidly obese on exam. ? ?Mild tachycardia noted, pulse 119 on arrival and 111 on EKG.  BP normal, 112/76.  Review of history shows she has baseline mild tachycardia.   ? ?Preop labs reviewed, anemia noted with hemoglobin 9.7, otherwise unremarkable.  Review of available history shows chronic anemia, last labs in [facility] Every[facility] 05/23/2019 with hemoglobin 10.4 at that time. ? ?Discussed case with anesthesiologist Dr. Marcie Bal.  He advised patient will be evaluated by assigned anesthesiologist on day of surgery and anesthesia plan will  be decided upon at that time I discussed with patient.  Vascular access options also needs to be evaluated at that time, she has a history of being a difficult stick. ? ?EKG 10/11/2021: Sinus tachycardia.  Rate 111. ? ?CT chest abdomen pelvis 05/23/2019 (Care Everywhere): ?FINDINGS:  ? ?CHEST:  ?Marland Kitchen  Thoracic inlet/axilla: Within normal limits.  ?.  Central airways: Within normal limits.  ?.  Mediastinum/hila: Within normal limits.  ?.  Heart/vessel: Normal heart size. No pericardial effusion. Aorta normal in caliber and appearance.  ?.  Lungs: Within normal limits.  ?.  Pleura: Within normal limits.  ? ?ABDOMEN:  ?.  Liver: Within normal limits.  ?.  Gallbladder/biliary: Gallbladder is decompressed without surrounding inflammatory changes.  ?.  Spleen: Within normal limits.  ?.  Pancreas: Within normal limits.  ?.  Adrenals: Within normal limits.  ?.  Kidneys: The renal sinus fat on the right appears slightly more hazy than on the left. No definite hydronephrosis. No nephrolithiasis.  ?.  Peritoneum/Mesentery: Within normal limits.  ?.  Extraperitoneum: Within normal limits.  ?.  GI tract: Within normal limits.  ?.  Vascular: Within normal limits.  ? ?PELVIS:  ?.  Ureters: Within normal limits.  ?.  Bladder: The bladder is displaced to the right by an enlarged fibroid uterus.  ?.  Reproductive system: Large mass within the abdomen without aggressive margins measuring 10.3 x 6.3 x 10.1 cm and appears continuous with the enlarged appearing uterus and may represent a fibroid.  ? ?MSK:  ?.  Severe scoliotic deformity of the spine as seen on prior radiographs with spinal fusion abnormalities. Remote deformities of the right proximal femur and bilateral  hips may be posttraumatic or congenital. No acute osseous abnormalities. ? ?Impression: ?1.  Renal sinus fat appears slightly more hazy on the right with slightly less distinct renal contour compared to the left. Recommend correlation with urinalysis for possible  pyelonephritis. Please note that noncontrast CT technique is less sensitive for infection.  ?2.  Large mass within the abdomen without aggressive margins measuring 10.3 x 6.3 x 10.1 cm and appears continuous with the enlarged appearing uterus and may represent a fibroid.  ?3.  Chronic skeletal findings as described ? ?CHEST - 2 VIEW 12/31/2017: ?COMPARISON:  09/29/2013 ?  ?FINDINGS: ?Cardiac shadow is stable. Lungs are well aerated. Severe scoliosis ?of the thoracic spine is noted stable from the prior exam. ?Shortening of the humeri are noted bilaterally. These bony changes ?are consistent with the patient's given clinical history. ?  ?IMPRESSION: ?Chronic changes without acute abnormality. ?  ? ? ? ?Karoline Caldwell, PA-C ?[facility]/Anesthesiology ?Phone [phone] ?10/12/2021 9:31 AM ? ?

## 2021-10-12 NOTE — Anesthesia Preprocedure Evaluation (Addendum)
Anesthesia Evaluation  ?Patient identified by MRN, date of birth, ID band ?Patient awake ? ? ? ?Reviewed: ?Allergy & Precautions, NPO status , Patient's Chart, lab work & pertinent test results ? ?Airway ?Mallampati: III ? ?TM Distance: <3 FB ?Neck ROM: Full ? ?Mouth opening: Limited Mouth Opening ? Dental ?no notable dental hx. ? ?  ?Pulmonary ?asthma ,  ?  ?Pulmonary exam normal ? ? ? ? ? ? ? Cardiovascular ?negative cardio ROS ? ? ?Rhythm:Regular Rate:Normal ? ? ?  ?Neuro/Psych ?negative neurological ROS ? negative psych ROS  ? GI/Hepatic ?negative GI ROS, Neg liver ROS,   ?Endo/Other  ?negative endocrine ROS ? Renal/GU ?negative Renal ROS  ? ?fibroids ? ?  ?Musculoskeletal ?negative musculoskeletal ROS ?(+)  ? Abdominal ?Normal abdominal exam  (+)   ?Peds ? Hematology ?negative hematology ROS ?(+)   ?Anesthesia Other Findings ? ? Reproductive/Obstetrics ? ?  ? ? ? ? ? ? ? ? ? ? ? ? ? ?  ?  ? ? ? ? ? ? ? ?Anesthesia Physical ?Anesthesia Plan ? ?ASA: 3 ? ?Anesthesia Plan: General  ? ?Post-op Pain Management: Regional block*, Tylenol PO (pre-op)* and Celebrex PO (pre-op)*  ? ?Induction: Intravenous ? ?PONV Risk Score and Plan: 3 and Ondansetron, Dexamethasone, Midazolam and Treatment may vary due to age or medical condition ? ?Airway Management Planned: Mask, Oral ETT, Video Laryngoscope Planned and Fiberoptic Intubation Planned ? ?Additional Equipment: None ? ?Intra-op Plan:  ? ?Post-operative Plan: Extubation in OR ? ?Informed Consent: I have reviewed the patients History and Physical, chart, labs and discussed the procedure including the risks, benefits and alternatives for the proposed anesthesia with the patient or authorized representative who has indicated his/her understanding and acceptance.  ? ? ? ?Dental advisory given ? ?Plan Discussed with: CRNA ? ?Anesthesia Plan Comments: (Lab Results ?     Component                Value               Date                 ?     WBC                       4.2                 10/11/2021           ?     HGB                      9.7 (L)             10/11/2021           ?     HCT                      31.0 (L)            10/11/2021           ?     MCV                      76.0 (L)            10/11/2021           ?     PLT  374                 10/11/2021           ?Lab Results ?     Component                Value               Date                 ?     NA                       137                 10/11/2021           ?     K                        4.0                 10/11/2021           ?     CO2                      24                  10/11/2021           ?     GLUCOSE                  77                  10/11/2021           ?     BUN                      7                   10/11/2021           ?     CREATININE               <0.30 (L)           10/11/2021           ?     CALCIUM                  9.0                 10/11/2021           ?     GFRNONAA                 NOT CALCULATED      10/11/2021           ?PAT note by Karoline Caldwell, PA-C: ?Evaluated patient at preadmission testing appointment due to history of diastrophic dysplasia and concern for difficult intubation. Diastrophic dysplasia is a disorder of cartilage and bone development and short stature with very short limbs. She also has associated kyphosis and scoliosis.   Her mobility is limited and she uses a motorized wheelchair when out.  Normal cognitive function, graduated from [employer] in 2012.  She has never had surgery or general anesthesia previously.  Review of available literature indicates that if tracheal intubation is required, excessive cervical manipulation is to be avoided due to potential for  cervical instability.  Patient is unaware of this ever being discussed, denies any history of neck pain.  She reports some limited cervical ROM, however on exam her cervical extension is reasonably good and she has a normal oral opening.  She denies any history  of pulmonary disease, she does have history of mild persistent asthma/cough variant asthma.  Spirometry 2019 showed normal ventilatory function.  She has no diagnosis of sleep apnea, however she does report significant snoring which gives me high concern for untreated OSA.  Patients with diastrophic dysplasia are also at risk for laryngotracheobronchomalacia.  Her BMI is calculated at 74, however I do not feel this accurately represents her body habitus.  She is not morbidly obese on exam. ? ?Mild tachycardia noted, pulse 119 on arrival and 111 on EKG.  BP normal, 112/76.  Review of history shows she has baseline mild tachycardia.   ? ?Preop labs reviewed, anemia noted with hemoglobin 9.7, otherwise unremarkable.  Review of available history shows chronic anemia, last labs in [facility] Everywhere 05/23/2019 with hemoglobin 10.4 at that time. ? ?Discussed case with anesthesiologist Dr. Marcie Bal.  He advised patient will be evaluated by assigned anesthesiologist on day of surgery and anesthesia plan will be decided upon at that time I discussed with patient.  Vascular access options also needs to be evaluated at that time, she has a history of being a difficult stick. ? ?EKG 10/11/2021: Sinus tachycardia.  Rate 111. ? ?CT chest abdomen pelvis 05/23/2019 (Care Everywhere): ?FINDINGS:  ? ?CHEST:  ?. ?Thoracic inlet/axilla: Within normal limits.  ?. ?Central airways: Within normal limits.  ?. ?Mediastinum/hila: Within normal limits.  ?. ?Heart/vessel: Normal heart size. No pericardial effusion. Aorta normal in caliber and appearance.  ?. ?Lungs: Within normal limits.  ?. ?Pleura: Within normal limits.  ? ?ABDOMEN:  ?. ?Liver: Within normal limits.  ?. ?Gallbladder/biliary: Gallbladder is decompressed without surrounding inflammatory changes.  ?. ?Spleen: Within normal limits.  ?. ?Pancreas: Within normal limits.  ?. ?Adrenals: Within normal limits.  ?. ?Kidneys: The renal sinus fat on the right appears slightly more hazy than  on the left. No definite hydronephrosis. No nephrolithiasis.  ?. ?Peritoneum/Mesentery: Within normal limits.  ?. ?Extraperitoneum: Within normal limits.  ?. ?GI tract: Within normal limits.  ?. ?Vascular: Within normal limits.  ? ?PELVIS:  ?. ?Ureters: Within normal limits.  ?. ?Bladder: The bladder is displaced to the right by an enlarged fibroid uterus.  ?. ?Reproductive system: Large mass within the abdomen without aggressive margins measuring 10.3 x 6.3 x 10.1 cm and appears continuous with the enlarged appearing uterus and may represent a fibroid.  ? ?MSK:  ?. ?Severe scoliotic deformity of the spine as seen on prior radiographs with spinal fusion abnormalities. Remote deformities of the right proximal femur and bilateral hips may be posttraumatic or congenital. No acute osseous abnormalities. ? ?Impression: ?1. ?Renal sinus fat appears slightly more hazy on the right with slightly less distinct renal contour compared to the left. Recommend correlation with urinalysis for possible pyelonephritis. Please note that noncontrast CT technique is less sensitive for infection.  ?2. ?Large mass within the abdomen without aggressive margins measuring 10.3 x 6.3 x 10.1 cm and appears continuous with the enlarged appearing uterus and may represent a fibroid.  ?3. ?Chronic skeletal findings as described ? ?CHEST - 2 VIEW 12/31/2017: ?COMPARISON: ?09/29/2013 ?? ?FINDINGS: ?Cardiac shadow is stable. Lungs are well aerated. Severe scoliosis ?of the thoracic spine is noted stable from the prior exam. ?Shortening of the  humeri are noted bilaterally. These bony changes ?are consistent with the patient's given clinical history. ?? ?IMPRESSION: ?Chronic changes without acute abnormality. ?)  ? ? ?Anesthesia Quick Evaluation ? ?

## 2021-10-12 NOTE — Progress Notes (Addendum)
Pt requests that friend/caregiver,Lamyiah,come to the pre op area on day of surgery. I spoke with charge nurse,Lindsi Forte,and pt's friend will be allowed in preop area. ?

## 2021-10-13 ENCOUNTER — Other Ambulatory Visit (HOSPITAL_COMMUNITY)
Admission: RE | Admit: 2021-10-13 | Discharge: 2021-10-13 | Disposition: A | Discharge status: Home / Self Care | Payer: Medicaid Other | Source: Ambulatory Visit | Attending: Obstetrics & Gynecology | Admitting: Obstetrics & Gynecology

## 2021-10-13 DIAGNOSIS — Z01818 Encounter for other preprocedural examination: Secondary | ICD-10-CM

## 2021-10-13 DIAGNOSIS — Z01812 Encounter for preprocedural laboratory examination: Secondary | ICD-10-CM | POA: Insufficient documentation

## 2021-10-13 DIAGNOSIS — Z20822 Contact with and (suspected) exposure to covid-19: Secondary | ICD-10-CM | POA: Diagnosis not present

## 2021-10-13 LAB — SARS CORONAVIRUS 2 BY RT PCR (HOSPITAL ORDER, PERFORMED IN ~~LOC~~ HOSPITAL LAB): SARS Coronavirus 2: NEGATIVE

## 2021-10-17 ENCOUNTER — Encounter (HOSPITAL_COMMUNITY): Payer: Self-pay | Admitting: Obstetrics & Gynecology

## 2021-10-17 ENCOUNTER — Encounter (HOSPITAL_COMMUNITY)
Admission: RE | Disposition: A | Discharge status: Home / Self Care | Payer: Self-pay | Source: Ambulatory Visit | Attending: Obstetrics & Gynecology

## 2021-10-17 ENCOUNTER — Inpatient Hospital Stay (HOSPITAL_COMMUNITY): Payer: Medicaid Other

## 2021-10-17 ENCOUNTER — Inpatient Hospital Stay (HOSPITAL_COMMUNITY)
Admission: RE | Admit: 2021-10-17 | Discharge: 2021-10-18 | DRG: 742 | Disposition: A | Discharge status: Home / Self Care | Payer: Medicaid Other | Source: Ambulatory Visit | Attending: Obstetrics & Gynecology | Admitting: Obstetrics & Gynecology

## 2021-10-17 ENCOUNTER — Inpatient Hospital Stay (HOSPITAL_COMMUNITY): Payer: Medicaid Other | Admitting: Anesthesiology

## 2021-10-17 ENCOUNTER — Other Ambulatory Visit: Payer: Self-pay

## 2021-10-17 ENCOUNTER — Inpatient Hospital Stay (HOSPITAL_COMMUNITY): Payer: Medicaid Other | Admitting: Physician Assistant

## 2021-10-17 DIAGNOSIS — D500 Iron deficiency anemia secondary to blood loss (chronic): Secondary | ICD-10-CM

## 2021-10-17 DIAGNOSIS — N809 Endometriosis, unspecified: Secondary | ICD-10-CM | POA: Diagnosis present

## 2021-10-17 DIAGNOSIS — D62 Acute posthemorrhagic anemia: Secondary | ICD-10-CM | POA: Diagnosis present

## 2021-10-17 DIAGNOSIS — E34328 Other genetic causes of short stature: Secondary | ICD-10-CM | POA: Diagnosis present

## 2021-10-17 DIAGNOSIS — D251 Intramural leiomyoma of uterus: Principal | ICD-10-CM | POA: Diagnosis present

## 2021-10-17 DIAGNOSIS — N920 Excessive and frequent menstruation with regular cycle: Secondary | ICD-10-CM | POA: Diagnosis not present

## 2021-10-17 DIAGNOSIS — J45991 Cough variant asthma: Secondary | ICD-10-CM | POA: Diagnosis present

## 2021-10-17 DIAGNOSIS — Z9889 Other specified postprocedural states: Secondary | ICD-10-CM

## 2021-10-17 DIAGNOSIS — J4530 Mild persistent asthma, uncomplicated: Secondary | ICD-10-CM | POA: Diagnosis present

## 2021-10-17 DIAGNOSIS — Z20822 Contact with and (suspected) exposure to covid-19: Secondary | ICD-10-CM | POA: Diagnosis present

## 2021-10-17 DIAGNOSIS — D259 Leiomyoma of uterus, unspecified: Secondary | ICD-10-CM

## 2021-10-17 DIAGNOSIS — D219 Benign neoplasm of connective and other soft tissue, unspecified: Secondary | ICD-10-CM

## 2021-10-17 DIAGNOSIS — T80212A Local infection due to central venous catheter, initial encounter: Secondary | ICD-10-CM

## 2021-10-17 HISTORY — PX: HYSTERECTOMY ABDOMINAL WITH SALPINGECTOMY: SHX6725

## 2021-10-17 LAB — POCT PREGNANCY, URINE: Preg Test, Ur: NEGATIVE

## 2021-10-17 SURGERY — HYSTERECTOMY, TOTAL, ABDOMINAL, WITH SALPINGECTOMY
Anesthesia: Regional | Laterality: Bilateral

## 2021-10-17 MED ORDER — LACTATED RINGERS IV SOLN: INTRAVENOUS | Status: DC

## 2021-10-17 MED ORDER — LACTATED RINGERS IV SOLN
INTRAVENOUS | Status: DC | PRN
Start: 2021-10-17 — End: 2021-10-17

## 2021-10-17 MED ORDER — FENTANYL CITRATE (PF) 100 MCG/2ML IJ SOLN
INTRAMUSCULAR | Status: DC | PRN
Start: 2021-10-17 — End: 2021-10-17
  Administered 2021-10-17 (×2): 50 ug via INTRAVENOUS

## 2021-10-17 MED ORDER — SOD CITRATE-CITRIC ACID 500-334 MG/5ML PO SOLN
ORAL | Status: AC
  Administered 2021-10-17: 30 mL via ORAL
  Filled 2021-10-17: qty 30

## 2021-10-17 MED ORDER — LIDOCAINE 2% (20 MG/ML) 5 ML SYRINGE
INTRAMUSCULAR | Status: DC | PRN
  Administered 2021-10-17: 40 mg via INTRAVENOUS

## 2021-10-17 MED ORDER — KETOROLAC TROMETHAMINE 30 MG/ML IJ SOLN
INTRAMUSCULAR | Status: AC
  Filled 2021-10-17: qty 1

## 2021-10-17 MED ORDER — DEXAMETHASONE SODIUM PHOSPHATE 10 MG/ML IJ SOLN
INTRAMUSCULAR | Status: DC | PRN
Start: 2021-10-17 — End: 2021-10-17
  Administered 2021-10-17: 4 mg via INTRAVENOUS

## 2021-10-17 MED ORDER — SOD CITRATE-CITRIC ACID 500-334 MG/5ML PO SOLN: 30.0000 mL | ORAL | Status: AC

## 2021-10-17 MED ORDER — ACETAMINOPHEN 325 MG PO TABS: 650.0000 mg | ORAL_TABLET | Freq: Four times a day (QID) | ORAL | Status: DC | PRN

## 2021-10-17 MED ORDER — PHENYLEPHRINE 40 MCG/ML (10ML) SYRINGE FOR IV PUSH (FOR BLOOD PRESSURE SUPPORT)
PREFILLED_SYRINGE | INTRAVENOUS | Status: AC
  Filled 2021-10-17: qty 20

## 2021-10-17 MED ORDER — 0.9 % SODIUM CHLORIDE (POUR BTL) OPTIME
TOPICAL | Status: DC | PRN
Start: 2021-10-17 — End: 2021-10-17
  Administered 2021-10-17: 2000 mL

## 2021-10-17 MED ORDER — ROCURONIUM BROMIDE 10 MG/ML (PF) SYRINGE
PREFILLED_SYRINGE | INTRAVENOUS | Status: DC | PRN
  Administered 2021-10-17: 30 mg via INTRAVENOUS
  Administered 2021-10-17 (×2): 5 mg via INTRAVENOUS

## 2021-10-17 MED ORDER — ACETAMINOPHEN 325 MG PO TABS
ORAL_TABLET | ORAL | Status: AC
  Administered 2021-10-17: 650 mg via ORAL
  Filled 2021-10-17: qty 2

## 2021-10-17 MED ORDER — LIDOCAINE 2% (20 MG/ML) 5 ML SYRINGE
INTRAMUSCULAR | Status: AC
  Filled 2021-10-17: qty 5

## 2021-10-17 MED ORDER — HEMOSTATIC AGENTS (NO CHARGE) OPTIME
TOPICAL | Status: DC | PRN
  Administered 2021-10-17: 1 via TOPICAL

## 2021-10-17 MED ORDER — CEFAZOLIN SODIUM-DEXTROSE 1-4 GM/50ML-% IV SOLN
1.0000 g | INTRAVENOUS | Status: AC
  Administered 2021-10-17: 1 g via INTRAVENOUS

## 2021-10-17 MED ORDER — BUPIVACAINE HCL (PF) 0.25 % IJ SOLN
INTRAMUSCULAR | Status: DC | PRN
  Administered 2021-10-17: 30 mL

## 2021-10-17 MED ORDER — BISACODYL 10 MG RE SUPP: 10.0000 mg | Freq: Every day | RECTAL | Status: DC | PRN

## 2021-10-17 MED ORDER — FENTANYL CITRATE (PF) 100 MCG/2ML IJ SOLN
25.0000 ug | INTRAMUSCULAR | Status: DC | PRN
  Administered 2021-10-17 (×4): 25 ug via INTRAVENOUS

## 2021-10-17 MED ORDER — SUGAMMADEX SODIUM 200 MG/2ML IV SOLN
INTRAVENOUS | Status: DC | PRN
  Administered 2021-10-17: 70 mg via INTRAVENOUS

## 2021-10-17 MED ORDER — PROPOFOL 10 MG/ML IV BOLUS
INTRAVENOUS | Status: DC | PRN
  Administered 2021-10-17: 70 mg via INTRAVENOUS

## 2021-10-17 MED ORDER — SENNOSIDES-DOCUSATE SODIUM 8.6-50 MG PO TABS: 1.0000 | ORAL_TABLET | Freq: Every evening | ORAL | Status: DC | PRN

## 2021-10-17 MED ORDER — CELECOXIB 200 MG PO CAPS
ORAL_CAPSULE | ORAL | Status: AC
  Administered 2021-10-17: 200 mg via ORAL
  Filled 2021-10-17: qty 1

## 2021-10-17 MED ORDER — HYDROMORPHONE HCL 1 MG/ML IJ SOLN
0.2000 mg | INTRAMUSCULAR | Status: DC | PRN
  Administered 2021-10-17: 0.2 mg via INTRAVENOUS
  Administered 2021-10-17: 0.5 mg via INTRAVENOUS
  Filled 2021-10-17 (×2): qty 0.5

## 2021-10-17 MED ORDER — SIMETHICONE 80 MG PO CHEW
80.0000 mg | CHEWABLE_TABLET | Freq: Four times a day (QID) | ORAL | Status: DC | PRN
  Administered 2021-10-18 (×2): 80 mg via ORAL
  Filled 2021-10-17 (×2): qty 1

## 2021-10-17 MED ORDER — KETOROLAC TROMETHAMINE 30 MG/ML IJ SOLN
15.0000 mg | Freq: Four times a day (QID) | INTRAMUSCULAR | Status: AC
  Administered 2021-10-17 – 2021-10-18 (×4): 15 mg via INTRAVENOUS
  Filled 2021-10-17 (×4): qty 1

## 2021-10-17 MED ORDER — PANTOPRAZOLE SODIUM 20 MG PO TBEC
20.0000 mg | DELAYED_RELEASE_TABLET | Freq: Every day | ORAL | Status: DC
  Administered 2021-10-17 – 2021-10-18 (×2): 20 mg via ORAL
  Filled 2021-10-17 (×2): qty 1

## 2021-10-17 MED ORDER — BUPIVACAINE HCL (PF) 0.25 % IJ SOLN
INTRAMUSCULAR | Status: AC
  Filled 2021-10-17: qty 30

## 2021-10-17 MED ORDER — BUPIVACAINE HCL (PF) 0.5 % IJ SOLN
INTRAMUSCULAR | Status: AC
  Filled 2021-10-17: qty 30

## 2021-10-17 MED ORDER — PROPOFOL 10 MG/ML IV BOLUS
INTRAVENOUS | Status: AC
  Filled 2021-10-17: qty 20

## 2021-10-17 MED ORDER — ONDANSETRON HCL 4 MG/2ML IJ SOLN
INTRAMUSCULAR | Status: AC
  Filled 2021-10-17: qty 2

## 2021-10-17 MED ORDER — ONDANSETRON HCL 4 MG/2ML IJ SOLN
INTRAMUSCULAR | Status: DC | PRN
  Administered 2021-10-17: 3.5 mg via INTRAVENOUS

## 2021-10-17 MED ORDER — FENTANYL CITRATE (PF) 100 MCG/2ML IJ SOLN
INTRAMUSCULAR | Status: AC
  Filled 2021-10-17: qty 2

## 2021-10-17 MED ORDER — OXYCODONE-ACETAMINOPHEN 5-325 MG PO TABS
0.5000 | ORAL_TABLET | ORAL | Status: DC | PRN
  Administered 2021-10-17 – 2021-10-18 (×5): 0.5 via ORAL
  Filled 2021-10-17 (×5): qty 1

## 2021-10-17 MED ORDER — PHENYLEPHRINE 40 MCG/ML (10ML) SYRINGE FOR IV PUSH (FOR BLOOD PRESSURE SUPPORT)
PREFILLED_SYRINGE | INTRAVENOUS | Status: DC | PRN
  Administered 2021-10-17: 120 ug via INTRAVENOUS
  Administered 2021-10-17: 80 ug via INTRAVENOUS
  Administered 2021-10-17: 40 ug via INTRAVENOUS
  Administered 2021-10-17: 80 ug via INTRAVENOUS
  Administered 2021-10-17: 120 ug via INTRAVENOUS
  Administered 2021-10-17 (×2): 80 ug via INTRAVENOUS
  Administered 2021-10-17: 120 ug via INTRAVENOUS
  Administered 2021-10-17: 80 ug via INTRAVENOUS

## 2021-10-17 MED ORDER — KETOROLAC TROMETHAMINE 30 MG/ML IJ SOLN
15.0000 mg | Freq: Four times a day (QID) | INTRAMUSCULAR | Status: DC
  Administered 2021-10-17: 15 mg via INTRAVENOUS

## 2021-10-17 MED ORDER — DEXTROSE-NACL 5-0.45 % IV SOLN: INTRAVENOUS | Status: AC

## 2021-10-17 MED ORDER — CHLORHEXIDINE GLUCONATE 0.12 % MT SOLN: 15.0000 mL | Freq: Once | OROMUCOSAL | Status: AC

## 2021-10-17 MED ORDER — MIDAZOLAM HCL 2 MG/2ML IJ SOLN
INTRAMUSCULAR | Status: AC
  Filled 2021-10-17: qty 2

## 2021-10-17 MED ORDER — ROCURONIUM BROMIDE 10 MG/ML (PF) SYRINGE
PREFILLED_SYRINGE | INTRAVENOUS | Status: AC
  Filled 2021-10-17: qty 10

## 2021-10-17 MED ORDER — DOCUSATE SODIUM 100 MG PO CAPS
100.0000 mg | ORAL_CAPSULE | Freq: Two times a day (BID) | ORAL | Status: DC
  Administered 2021-10-17 – 2021-10-18 (×3): 100 mg via ORAL
  Filled 2021-10-17 (×3): qty 1

## 2021-10-17 MED ORDER — ORAL CARE MOUTH RINSE: 15.0000 mL | Freq: Once | OROMUCOSAL | Status: AC

## 2021-10-17 MED ORDER — CEFAZOLIN SODIUM-DEXTROSE 2-4 GM/100ML-% IV SOLN
INTRAVENOUS | Status: AC
  Filled 2021-10-17: qty 100

## 2021-10-17 MED ORDER — CELECOXIB 200 MG PO CAPS: 200.0000 mg | ORAL_CAPSULE | Freq: Once | ORAL | Status: AC

## 2021-10-17 MED ORDER — CHLORHEXIDINE GLUCONATE 0.12 % MT SOLN
OROMUCOSAL | Status: AC
  Administered 2021-10-17: 15 mL via OROMUCOSAL
  Filled 2021-10-17: qty 15

## 2021-10-17 MED ORDER — POVIDONE-IODINE 10 % EX SWAB
2.0000 "application " | Freq: Once | CUTANEOUS | Status: AC
  Administered 2021-10-17: 2 via TOPICAL

## 2021-10-17 MED ORDER — EPHEDRINE SULFATE-NACL 50-0.9 MG/10ML-% IV SOSY
PREFILLED_SYRINGE | INTRAVENOUS | Status: DC | PRN
  Administered 2021-10-17: 2.5 mg via INTRAVENOUS
  Administered 2021-10-17: 5 mg via INTRAVENOUS

## 2021-10-17 MED ORDER — MIDAZOLAM HCL 2 MG/2ML IJ SOLN
INTRAMUSCULAR | Status: DC | PRN
  Administered 2021-10-17: 2 mg via INTRAVENOUS

## 2021-10-17 MED ORDER — ONDANSETRON HCL 4 MG/2ML IJ SOLN
4.0000 mg | Freq: Three times a day (TID) | INTRAMUSCULAR | Status: DC | PRN
  Administered 2021-10-17 – 2021-10-18 (×2): 4 mg via INTRAVENOUS
  Filled 2021-10-17 (×2): qty 2

## 2021-10-17 MED ORDER — FENTANYL CITRATE (PF) 250 MCG/5ML IJ SOLN
INTRAMUSCULAR | Status: AC
  Filled 2021-10-17: qty 5

## 2021-10-17 MED ORDER — DEXAMETHASONE SODIUM PHOSPHATE 10 MG/ML IJ SOLN
INTRAMUSCULAR | Status: AC
  Filled 2021-10-17: qty 1

## 2021-10-17 MED ORDER — IBUPROFEN 200 MG PO TABS
200.0000 mg | ORAL_TABLET | Freq: Four times a day (QID) | ORAL | Status: DC
  Administered 2021-10-18: 200 mg via ORAL
  Filled 2021-10-17 (×2): qty 1

## 2021-10-17 SURGICAL SUPPLY — 51 items
BAG COUNTER SPONGE SURGICOUNT (BAG) ×2 IMPLANT
BAG SURGICOUNT SPONGE COUNTING (BAG) ×1
BARRIER ADHS 3X4 INTERCEED (GAUZE/BANDAGES/DRESSINGS) IMPLANT
BENZOIN TINCTURE PRP APPL 2/3 (GAUZE/BANDAGES/DRESSINGS) IMPLANT
CANISTER SUCT 3000ML PPV (MISCELLANEOUS) ×3 IMPLANT
CATH FOLEY 2WAY SLVR  5CC 12FR (CATHETERS) ×2
CATH FOLEY 2WAY SLVR 5CC 12FR (CATHETERS) IMPLANT
CLIP VESOCCLUDE SM NARROW 6/CT (CLIP) ×1 IMPLANT
CLOSURE WOUND 1/2 X4 (GAUZE/BANDAGES/DRESSINGS)
DECANTER SPIKE VIAL GLASS SM (MISCELLANEOUS) IMPLANT
DRAPE LAPAROTOMY 100X72 PEDS (DRAPES) ×2 IMPLANT
DRAPE WARM FLUID 44X44 (DRAPES) IMPLANT
DRSG OPSITE POSTOP 4X10 (GAUZE/BANDAGES/DRESSINGS) ×3 IMPLANT
DRSG PAD ABDOMINAL 8X10 ST (GAUZE/BANDAGES/DRESSINGS) ×3 IMPLANT
DURAPREP 26ML APPLICATOR (WOUND CARE) ×3 IMPLANT
ELECT LOOP LEEP RND 20X12 WHT (CUTTING LOOP)
ELECTRODE LOOP LP RND 20X12WHT (CUTTING LOOP) ×1 IMPLANT
GAUZE 4X4 16PLY ~~LOC~~+RFID DBL (SPONGE) ×3 IMPLANT
GAUZE SPONGE 4X4 12PLY STRL (GAUZE/BANDAGES/DRESSINGS) ×3 IMPLANT
GLOVE SURG ENC MOIS LTX SZ7 (GLOVE) ×3 IMPLANT
GLOVE SURG UNDER POLY LF SZ7 (GLOVE) ×9 IMPLANT
GOWN STRL REUS W/ TWL LRG LVL3 (GOWN DISPOSABLE) ×2 IMPLANT
GOWN STRL REUS W/ TWL XL LVL3 (GOWN DISPOSABLE) ×1 IMPLANT
GOWN STRL REUS W/TWL LRG LVL3 (GOWN DISPOSABLE) ×4
GOWN STRL REUS W/TWL XL LVL3 (GOWN DISPOSABLE) ×2
HEMOSTAT ARISTA ABSORB 3G PWDR (HEMOSTASIS) ×2 IMPLANT
HIBICLENS CHG 4% 4OZ BTL (MISCELLANEOUS) ×3 IMPLANT
KIT TURNOVER KIT B (KITS) ×3 IMPLANT
LIGASURE IMPACT 36 18CM CVD LR (INSTRUMENTS) IMPLANT
NEEDLE HYPO 22GX1.5 SAFETY (NEEDLE) ×3 IMPLANT
NS IRRIG 1000ML POUR BTL (IV SOLUTION) ×3 IMPLANT
PACK ABDOMINAL GYN (CUSTOM PROCEDURE TRAY) ×3 IMPLANT
PAD ARMBOARD 7.5X6 YLW CONV (MISCELLANEOUS) ×3 IMPLANT
PAD OB MATERNITY 4.3X12.25 (PERSONAL CARE ITEMS) ×3 IMPLANT
PENCIL SMOKE EVAC W/HOLSTER (ELECTROSURGICAL) ×1 IMPLANT
SPONGE T-LAP 18X18 ~~LOC~~+RFID (SPONGE) ×8 IMPLANT
STRIP CLOSURE SKIN 1/2X4 (GAUZE/BANDAGES/DRESSINGS) IMPLANT
SUT VIC AB 0 CT1 18XCR BRD8 (SUTURE) ×3 IMPLANT
SUT VIC AB 0 CT1 27 (SUTURE) ×4
SUT VIC AB 0 CT1 27XBRD ANBCTR (SUTURE) ×2 IMPLANT
SUT VIC AB 0 CT1 36 (SUTURE) ×3 IMPLANT
SUT VIC AB 0 CT1 8-18 (SUTURE) ×6
SUT VIC AB 3-0 CT1 27 (SUTURE) ×2
SUT VIC AB 3-0 CT1 TAPERPNT 27 (SUTURE) ×1 IMPLANT
SUT VIC AB 3-0 SH 27 (SUTURE)
SUT VIC AB 3-0 SH 27X BRD (SUTURE) IMPLANT
SUT VIC AB 4-0 KS 27 (SUTURE) IMPLANT
SUT VICRYL 0 TIES 12 18 (SUTURE) ×3 IMPLANT
SYR CONTROL 10ML LL (SYRINGE) ×3 IMPLANT
TOWEL GREEN STERILE FF (TOWEL DISPOSABLE) ×6 IMPLANT
TRAY FOLEY W/BAG SLVR 14FR (SET/KITS/TRAYS/PACK) ×1 IMPLANT

## 2021-10-17 NOTE — Brief Op Note (Signed)
10/17/2021 ? ?10:41 AM ? ?PATIENT:  Cindy Goodwin  36 y.o. female ? ?PRE-OPERATIVE DIAGNOSIS:  Fibroids ? ?POST-OPERATIVE DIAGNOSIS:  Fibroids ? ?PROCEDURE:  Procedure(s): ?HYSTERECTOMY ABDOMINAL WITH SALPINGECTOMY (Bilateral) ? ?SURGEON:  Surgeon(s) and Role: ?   Lavonia Drafts, MD - Primary ?   Radene Gunning, MD - Assisting ? ?ANESTHESIA:   local and general ? ?EBL:  150 mL  ? ?BLOOD ADMINISTERED:none ? ?DRAINS: none  ? ?LOCAL MEDICATIONS USED:  MARCAINE    ? ?SPECIMEN:  Source of Specimen:  uterus, fallopian tubes and cervix.  ? ?DISPOSITION OF SPECIMEN:  PATHOLOGY ? ?COUNTS:  YES ? ?TOURNIQUET:  * No tourniquets in log * ? ?DICTATION: .Note written in EPIC ? ?PLAN OF CARE: Admit to inpatient  ? ?PATIENT DISPOSITION:  PACU - hemodynamically stable. ?  ?Delay start of Pharmacological VTE agent (>24hrs) due to surgical blood loss or risk of bleeding: no ? ?Complications: none immediate  ? ?Damere Brandenburg L. Harraway-Smith, M.D., [place] ?  ? ?

## 2021-10-17 NOTE — Anesthesia Procedure Notes (Signed)
Central Venous Catheter Insertion ?Performed by: Darral Dash, DO, anesthesiologist ?Start/End3/02/2022 8:00 AM, 10/17/2021 8:20 AM ?Patient location: Pre-op. ?Preanesthetic checklist: patient identified, IV checked, site marked, risks and benefits discussed, surgical consent, monitors and equipment checked, pre-op evaluation, timeout performed and anesthesia consent ?Position: Trendelenburg ?Lidocaine 1% used for infiltration and patient sedated ?Hand hygiene performed , maximum sterile barriers used  and Seldinger technique used ?Catheter size: 8 Fr ?Total catheter length 15. ?Central line was placed.Triple lumen ?Procedure performed using ultrasound guided technique. ?Ultrasound Notes:anatomy identified, needle tip was noted to be adjacent to the nerve/plexus identified, no ultrasound evidence of intravascular and/or intraneural injection and image(s) printed for medical record ?Attempts: 3 ?Following insertion, dressing applied, line sutured and Biopatch. ?Post procedure assessment: blood return through all ports ? ?Patient tolerated the procedure well with no immediate complications. ?Additional procedure comments: Difficult placement 2/2 patient's known anatomical variants. Unable to feed catheter over successful wire placement with pediatric kits so decision made for adult size triple lumen CVC. Placement uneventful without complication. . ? ? ? ? ? ?

## 2021-10-17 NOTE — Transfer of Care (Signed)
Immediate Anesthesia Transfer of Care Note ? ?Patient: Cindy Goodwin ? ?Procedure(s) Performed: HYSTERECTOMY ABDOMINAL WITH SALPINGECTOMY (Bilateral) ? ?Patient Location: PACU ? ?Anesthesia Type:General ? ?Level of Consciousness: awake and oriented ? ?Airway & Oxygen Therapy: Patient Spontanous Breathing and Patient connected to nasal cannula oxygen ? ?Post-op Assessment: Report given to RN ? ?Post vital signs: Reviewed and stable ? ?Last Vitals:  ?Vitals Value Taken Time  ?BP 115/58 10/17/21 1051  ?Temp    ?Pulse 93 10/17/21 1053  ?Resp 19 10/17/21 1053  ?SpO2 100 % 10/17/21 1053  ?Vitals shown include unvalidated device data. ? ?Last Pain:  ?Vitals:  ? 10/17/21 0622  ?PainSc: 0-No pain  ?   ? ?Patients Stated Pain Goal: 0 (10/17/21 0622) ? ?Complications: No notable events documented. ?

## 2021-10-17 NOTE — Progress Notes (Signed)
Attempted to get pt up out of bed. Pt was able to walk a few steps but then became nauseous and sweaty. VSS. Pt requested to rest for a few moments and to try again later. Pt now back in bed and comfortable. Foley catheter remains.  ?

## 2021-10-17 NOTE — Anesthesia Procedure Notes (Signed)
Procedure Name: Intubation ?Date/Time: 10/17/2021 8:54 AM ?Performed by: Barrington Ellison, CRNA ?Pre-anesthesia Checklist: Patient identified, Emergency Drugs available, Suction available and Patient being monitored ?Patient Re-evaluated:Patient Re-evaluated prior to induction ?Oxygen Delivery Method: Circle System Utilized ?Preoxygenation: Pre-oxygenation with 100% oxygen ?Induction Type: IV induction ?Ventilation: Mask ventilation without difficulty ?Laryngoscope Size: Glidescope and 2 ?Grade View: Grade I ?Tube type: Oral ?Tube size: 6.5 mm ?Number of attempts: 1 ?Airway Equipment and Method: Stylet and Oral airway ?Placement Confirmation: ETT inserted through vocal cords under direct vision, positive ETCO2 and breath sounds checked- equal and bilateral ?Secured at: 16 cm ?Tube secured with: Tape ?Dental Injury: Teeth and Oropharynx as per pre-operative assessment  ?Difficulty Due To: Difficulty was anticipated and Difficult Airway-  due to neck instability ?Future Recommendations: Recommend- induction with short-acting agent, and alternative techniques readily available ?Comments: Elective Glidescope d/t anatomy - recommend glidescope for all future intubations. ? ? ? ? ?

## 2021-10-17 NOTE — Op Note (Signed)
10/17/2021 ? ?10:41 AM ? ?PATIENT:  Cindy Goodwin  36 y.o. female ? ?PRE-OPERATIVE DIAGNOSIS:  Fibroids ? ?POST-OPERATIVE DIAGNOSIS:  Fibroids ? ?PROCEDURE:  Procedure(s): ?HYSTERECTOMY ABDOMINAL WITH SALPINGECTOMY (Bilateral) ? ?SURGEON:  Surgeon(s) and Role: ?   Lavonia Drafts, MD - Primary ?   Radene Gunning, MD - Assisting ? ?ANESTHESIA:   local and general ? ?EBL:  150 mL  ? ?BLOOD ADMINISTERED:none ? ?DRAINS: none  ? ?LOCAL MEDICATIONS USED:  MARCAINE    ? ?SPECIMEN:  Source of Specimen:  uterus, fallopian tubes and cervix.  ? ?DISPOSITION OF SPECIMEN:  PATHOLOGY ? ?COUNTS:  YES ? ?TOURNIQUET:  * No tourniquets in log * ? ?DICTATION: .Note written in EPIC ? ?PLAN OF CARE: Admit to inpatient  ? ?PATIENT DISPOSITION:  PACU - hemodynamically stable. ?  ?Delay start of Pharmacological VTE agent (>24hrs) due to surgical blood loss or risk of bleeding: no ? ?Complications: none immediate  ? ? ?INDICATIONS: The patient is a 35y.o. G0 with the aforementioned diagnoses who desires definitive surgical management.  ? ?Procedure: On the preoperative visit, the risks, benefits, indications, and alternatives of the procedure were reviewed with the patient. On the day of surgery, the risks of surgery were again discussed with the patient including but not limited to: bleeding which may require transfusion or reoperation; infection which may require antibiotics; injury to bowel, bladder, ureters or other surrounding organs; need for additional procedures; thromboembolic phenomenon, incisional problems and other postoperative/anesthesia complications. Written informed consent was obtained.  ? ?OPERATIVE FINDINGS: A 10cm uterus with a large ~20cm pedunculated fibroid noted. Normal tubes and ovaries bilaterally. There was evidence of endometriosis and minimal adhesions noted.  ? ?SPECIMENS: Uterus,cervix, bilateral fallopian tubes sent to pathology  ? ?COMPLICATIONS: none noted.  ? ?DESCRIPTION OF PROCEDURE: The  patient received intravenous antibiotics. Sequential compression devices were not applied due to pts body habitus. Neither adult or Ped SCDs would fit. She was taken to the operating room and placed under general anesthesia without difficulty. The abdomen and perineum were prepped and draped in a sterile manner, and she was placed in a dorsal supine position. A Foley catheter was inserted into the bladder and attached to constant drainage. After an adequate timeout was performed, a transverse skin incision was made. This incision was taken down to the fascia using a scalpel and cautery with care given to maintain good hemostasis. The fascia was grasped with kocher clamps, tented up and the rectus muscles were dissected off using sharp and blunt dissection on the superior and inferior aspects of the fascial incision. The peritoneum entered sharply without complication. This peritoneal cavity was entered digitally. Attention was then turned to the pelvis. The uterus at this point was noted to be mobilized and was delivered up out of the abdomen. The bowel was packed away with moist laparotomy sponges. The round ligaments on each side were clamped, suture ligated with 0 Vicryl, and transected with electrocautery allowing entry into the broad ligament. Of note, all sutures used in this procedure are 0 Vicryl unless otherwise noted. The anterior and posterior leaves of the broad ligament were separated, and the ureters were inspected to be safely away from the area of dissection bilaterally. The uteroovarian ligaments were bliaterally clamped and doubly suture ligated. The uterine arteries were skeletinized bilaterally A bladder flap was then created. The bladder was then bluntly dissected off the lower uterine segment and cervix with good hemostasis noted. The uterine arteries were then skeletonized bilaterally and then  clamped, cut, and doubly suture ligated with care given to prevent ureteral injury. The uterosacral  ligaments were then clamped, cut, and ligated bilaterally. Finally, the cardinal ligaments were clamped, cut, and ligated bilaterally. The uterus was removed.  The cuff angles were closed with multiple figure-of-eight sutures.   At this point the fallopian tubes were grasped with a Babcock clamp.  Using a kelly clamp the mesosalpinx was clamped cut and suture ligated, excellent hemostasis was noted.  The pelvis was irrigated and hemostasis was reconfirmed at all pedicles and along the pelvic sidewall. The ureters were inspected and noted to be peristalsing bilaterally. Arista was placed over the vaginal cuff and the pedicles after the pelvis was irrigated.  The peritoneum was reapproximated with 0 vicryl.  The fascia was closed with 0 vicryl . The subcutaneous fascia was closed with 3-0 vicryl and the skin was a subcuticular suture of 3-0 vicryl. The incision was injected with 30cc of 0.25% Marcaine. Benzoin and steristrips were applied.  Sponge, lap and needle counts were correct times two. The patient was taken to the recovery area awake, extubated and in stable condition.  ? ?An experienced assistant was required given the standard of surgical care given the complexity of the case.  This assistant was needed for exposure, dissection, suctioning, retraction, instrument exchange and for overall help during the procedure. ? ?Austen Wygant L. Harraway-Smith, M.D., [place] ? ? ?  ?  ?

## 2021-10-17 NOTE — Anesthesia Postprocedure Evaluation (Signed)
Anesthesia Post Note ? ?Patient: Cindy Goodwin ? ?Procedure(s) Performed: HYSTERECTOMY ABDOMINAL WITH SALPINGECTOMY (Bilateral) ? ?  ? ?Patient location during evaluation: PACU ?Anesthesia Type: General ?Level of consciousness: awake and alert ?Pain management: pain level controlled ?Vital Signs Assessment: post-procedure vital signs reviewed and stable ?Respiratory status: spontaneous breathing, nonlabored ventilation, respiratory function stable and patient connected to nasal cannula oxygen ?Cardiovascular status: blood pressure returned to baseline and stable ?Postop Assessment: no apparent nausea or vomiting ?Anesthetic complications: no ? ? ?No notable events documented. ? ?Last Vitals:  ?Vitals:  ? 10/17/21 1220 10/17/21 1235  ?BP: (!) 98/57 (!) 105/59  ?Pulse: 72 87  ?Resp: 20 20  ?Temp:  36.7 ?C  ?SpO2: 98% 99%  ?  ?Last Pain:  ?Vitals:  ? 10/17/21 1235  ?PainSc: Asleep  ? ? ?  ?  ?  ?  ?  ?  ? ?March Rummage Filiberto Wamble ? ? ? ? ?

## 2021-10-17 NOTE — H&P (Signed)
Preoperative History and Physical ? ?Cindy Goodwin is a 36 y.o. G0P0000 here for surgical management of fibroids, heavy bleeding and anemia.    ? ?Proposed surgery: Total abdominal hysterectomy with bilateral salpingectomy ? ?Past Medical History:  ?Diagnosis Date  ? Diastrophic dysplasia   ? Kyphosis   ? Mild persistent asthma 12/31/2017  ? Palpitations   ? Scoliosis   ? ?Past Surgical History:  ?Procedure Laterality Date  ? NO PAST SURGERIES    ? ?OB History   ? ? Gravida  ?0  ? Para  ?0  ? Term  ?0  ? Preterm  ?0  ? AB  ?0  ? Living  ?0  ?  ? ? SAB  ?0  ? IAB  ?0  ? Ectopic  ?0  ? Multiple  ?0  ? Live Births  ?0  ?   ?  ?  ? ?Patient denies any cervical dysplasia or STIs. ?Medications Prior to Admission  ?Medication Sig Dispense Refill Last Dose  ? albuterol (PROVENTIL HFA;VENTOLIN HFA) 108 (90 Base) MCG/ACT inhaler Inhale 1-2 puffs into the lungs every 6 (six) hours as needed for wheezing or shortness of breath. 18 g 1 Past Week  ? ibuprofen (ADVIL) 200 MG tablet Take 400 mg by mouth every 4 (four) hours as needed for moderate pain.   Past Month  ? loratadine (CLARITIN) 10 MG tablet Take 10 mg by mouth daily as needed for allergies.   Past Month  ?  ?No Known Allergies ?Social History:   reports that she has never smoked. She has never used smokeless tobacco. She reports current alcohol use. She reports that she does not use drugs. ?Family History  ?Problem Relation Age of Onset  ? Allergic rhinitis Maternal Aunt   ? Allergic rhinitis Maternal Uncle   ? Cancer Maternal Grandmother   ? Diabetes Neg Hx   ? Hypertension Neg Hx   ? ? ?Review of Systems: Noncontributory ? ?PHYSICAL EXAM: ?Blood pressure (!) 102/59, pulse 97, temperature 98.9 ?F (37.2 ?C), resp. rate 18, height '2\' 4"'$  (0.711 m), weight 34.9 kg, last menstrual period 10/09/2021, SpO2 98 %. ?General appearance - alert, well appearing, and in no distress ?Chest - clear to auscultation, no wheezes, rales or rhonchi, symmetric air entry ?Heart - normal  rate and regular rhythm ?Abdomen - soft, nontender, nondistended, no masses or organomegaly ?Pelvic - examination not indicated ?Extremities - peripheral pulses normal, no pedal edema, no clubbing or cyanosis ? ?Labs: ?Results for orders placed or performed during the hospital encounter of 10/17/21 (from the past 336 hour(s))  ?Pregnancy, urine POC  ? Collection Time: 10/17/21  6:39 AM  ?Result Value Ref Range  ? Preg Test, Ur NEGATIVE NEGATIVE  ?Results for orders placed or performed during the hospital encounter of 10/13/21 (from the past 336 hour(s))  ?SARS Coronavirus 2 by RT PCR (hospital order, performed in [facility] hospital lab) Nasopharyngeal Nasopharyngeal Swab  ? Collection Time: 10/13/21 12:24 PM  ? Specimen: Nasopharyngeal Swab  ?Result Value Ref Range  ? SARS Coronavirus 2 NEGATIVE NEGATIVE  ?Results for orders placed or performed during the hospital encounter of 10/11/21 (from the past 336 hour(s))  ?CBC  ? Collection Time: 10/11/21  1:43 PM  ?Result Value Ref Range  ? WBC 4.2 4.0 - 10.5 K/uL  ? RBC 4.08 3.87 - 5.11 MIL/uL  ? Hemoglobin 9.7 (L) 12.0 - 15.0 g/dL  ? HCT 31.0 (L) 36.0 - 46.0 %  ? MCV 76.0 (L) 80.0 -  100.0 fL  ? MCH 23.8 (L) 26.0 - 34.0 pg  ? MCHC 31.3 30.0 - 36.0 g/dL  ? RDW 21.1 (H) 11.5 - 15.5 %  ? Platelets 374 150 - 400 K/uL  ? nRBC 0.0 0.0 - 0.2 %  ?Basic metabolic panel per protocol  ? Collection Time: 10/11/21  1:43 PM  ?Result Value Ref Range  ? Sodium 137 135 - 145 mmol/L  ? Potassium 4.0 3.5 - 5.1 mmol/L  ? Chloride 104 98 - 111 mmol/L  ? CO2 24 22 - 32 mmol/L  ? Glucose, Bld 77 70 - 99 mg/dL  ? BUN 7 6 - 20 mg/dL  ? Creatinine, Ser <0.30 (L) 0.44 - 1.00 mg/dL  ? Calcium 9.0 8.9 - 10.3 mg/dL  ? GFR, Estimated NOT CALCULATED >60 mL/min  ? Anion gap 9 5 - 15  ?Type and screen [place]  ? Collection Time: 10/11/21  2:15 PM  ?Result Value Ref Range  ? ABO/RH(D) O POS   ? Antibody Screen NEG   ? Sample Expiration 10/25/2021,2359   ? Extend sample reason    ?   NO TRANSFUSIONS OR PREGNANCY IN THE PAST 3 MONTHS ?Performed at [facility], [place] [address], [place], [place] [address] ?  ? ? ?Imaging Studies: ?No results found. ? ?Assessment: ?Patient Active Problem List  ? Diagnosis Date Noted  ? Post-operative state 10/17/2021  ? Fibroids, intramural 10/17/2021  ? Anemia due to chronic blood loss 10/17/2021  ? 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 Menorrhagia with regular cycle 10/17/2021  ? Cough, persistent 12/31/2017  ? 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 Mild persistent asthma/cough variant asthma 12/31/2017  ? Chronic rhinitis 12/31/2017  ? ? ?Plan: ?Patient will undergo surgical management with Total abdominal hysterectomy with bilateral salpingectomy.   The risks of surgery were discussed in detail with the patient including but not limited to: bleeding which may require transfusion or reoperation; infection which may require antibiotics; injury to surrounding organs which may involve bowel, bladder, ureters ; need for additional procedures including laparoscopy or laparotomy; thromboembolic phenomenon, surgical site problems and other postoperative/anesthesia complications. Likelihood of success in alleviating the patient's condition was discussed. Routine postoperative instructions will be reviewed with the patient and her family in detail after surgery.  The patient concurred with the proposed plan, giving informed written consent for the surgery.  Patient has been NPO since last night she will remain NPO for procedure.  Anesthesia and OR aware.  Preoperative prophylactic antibiotics and SCDs ordered on call to the OR.  To OR when ready. ? ?Kosha Jaquith L. Ihor Dow, M.D., [place] ?10/17/2021 7:10 AM ?  ? ?

## 2021-10-18 ENCOUNTER — Encounter (HOSPITAL_COMMUNITY): Payer: Self-pay | Admitting: Obstetrics & Gynecology

## 2021-10-18 ENCOUNTER — Telehealth: Payer: Self-pay | Admitting: General Practice

## 2021-10-18 DIAGNOSIS — N920 Excessive and frequent menstruation with regular cycle: Secondary | ICD-10-CM

## 2021-10-18 DIAGNOSIS — D251 Intramural leiomyoma of uterus: Principal | ICD-10-CM

## 2021-10-18 DIAGNOSIS — D219 Benign neoplasm of connective and other soft tissue, unspecified: Secondary | ICD-10-CM

## 2021-10-18 LAB — CBC
HCT: 23.2 % — ABNORMAL LOW (ref 36.0–46.0)
Hemoglobin: 7.1 g/dL — ABNORMAL LOW (ref 12.0–15.0)
MCH: 23.3 pg — ABNORMAL LOW (ref 26.0–34.0)
MCHC: 30.6 g/dL (ref 30.0–36.0)
MCV: 76.1 fL — ABNORMAL LOW (ref 80.0–100.0)
Platelets: 361 10*3/uL (ref 150–400)
RBC: 3.05 MIL/uL — ABNORMAL LOW (ref 3.87–5.11)
RDW: 20 % — ABNORMAL HIGH (ref 11.5–15.5)
WBC: 9.7 10*3/uL (ref 4.0–10.5)
nRBC: 0 % (ref 0.0–0.2)

## 2021-10-18 LAB — ABO/RH: ABO/RH(D): O POS

## 2021-10-18 MED ORDER — SODIUM CHLORIDE 0.9% FLUSH
10.0000 mL | INTRAVENOUS | Status: DC | PRN
  Administered 2021-10-18: 10 mL

## 2021-10-18 MED ORDER — DOCUSATE SODIUM 100 MG PO CAPS: 100.0000 mg | ORAL_CAPSULE | Freq: Two times a day (BID) | ORAL | 0 refills | Status: DC

## 2021-10-18 MED ORDER — CHLORHEXIDINE GLUCONATE CLOTH 2 % EX PADS: 6.0000 | MEDICATED_PAD | Freq: Every day | CUTANEOUS | Status: DC

## 2021-10-18 MED ORDER — IBUPROFEN 400 MG PO TABS: 400.0000 mg | ORAL_TABLET | Freq: Four times a day (QID) | ORAL | 1 refills | Status: DC | PRN

## 2021-10-18 MED ORDER — SODIUM CHLORIDE 0.9% FLUSH: 10.0000 mL | Freq: Two times a day (BID) | INTRAVENOUS | Status: DC

## 2021-10-18 MED ORDER — ASPIRIN EC 81 MG PO TBEC: 81.0000 mg | DELAYED_RELEASE_TABLET | Freq: Every day | ORAL | 0 refills | Status: AC

## 2021-10-18 MED ORDER — OXYCODONE-ACETAMINOPHEN 5-325 MG PO TABS: 0.5000 | ORAL_TABLET | ORAL | 0 refills | Status: DC | PRN

## 2021-10-18 MED ORDER — ENOXAPARIN SODIUM 300 MG/3ML IJ SOLN
15.0000 mg | INTRAMUSCULAR | Status: DC
  Filled 2021-10-18: qty 0.15

## 2021-10-18 NOTE — Discharge Summary (Signed)
Physician Discharge Summary  ?Patient ID: ?Cindy Goodwin ?MRN: [id number] ?DOB/AGE: [birth year] 36 y.o. ? ?Admit date: 10/17/2021 ?Discharge date: 10/18/2021 ? ?Admission Diagnoses: Heavy menses; uterine fibroids.  ? ?Discharge Diagnoses:  ?Principal Problem: ?  Fibroids, intramural ?Active Problems: ?  Post-operative state ?  Anemia due to chronic blood loss ?  Dwarfism ?  Menorrhagia with regular cycle ?Acute on chronic blood loss anemia ? ?Discharged Condition: good ? ?Hospital Course: Patient had an uncomplicated surgery; for further details of this surgery, please refer to the operative note. Furthermore, the patient had an uncomplicated postoperative course.  By time of discharge, her pain was controlled on oral pain medications; she was ambulating, voiding without difficulty, tolerating regular diet and passing flatus.  She was deemed stable for discharge to home.    ? ?Consults:  pharmacy ? ?Significant Diagnostic Studies: labs: CBC ? ?Treatments: surgery: total abdominal hysterectomy with bilateral salpingectomy  ? ?Discharge Exam: ?Blood pressure (!) 102/40, pulse (!) 113, temperature 99.2 ?F (37.3 ?C), temperature source Oral, resp. rate 18, height '2\' 4"'$  (0.711 m), weight 34.9 kg, last menstrual period 10/09/2021, SpO2 98 %. ?General appearance: alert and no distress ?Resp: clear to auscultation bilaterally ?Cardio: regular rate and rhythm, S1, S2 normal, no murmur, click, rub or gallop ?GI: appropriate post op. Dressing C/D/I  ?Extremities: extremities normal, atraumatic, no cyanosis or edema ?Skin: Skin color, texture, turgor normal. No rashes or lesions ? ?Disposition: Discharge disposition: 01-Home or Self Care ? ? ? ? ? ? ?Discharge Instructions   ? ? Call MD for:  difficulty breathing, headache or visual disturbances   Complete by: As directed ?  ? Call MD for:  extreme fatigue   Complete by: As directed ?  ? Call MD for:  persistant dizziness or light-headedness   Complete by: As directed ?  ? Call MD  for:  persistant nausea and vomiting   Complete by: As directed ?  ? Call MD for:  redness, tenderness, or signs of infection (pain, swelling, redness, odor or green/yellow discharge around incision site)   Complete by: As directed ?  ? Call MD for:  severe uncontrolled pain   Complete by: As directed ?  ? Call MD for:  temperature >100.4   Complete by: As directed ?  ? Diet general   Complete by: As directed ?  ? Discharge wound care:   Complete by: As directed ?  ? Remove outer honeycomb dressing in 4 days  ? Sexual Activity Restrictions   Complete by: As directed ?  ? Nothing in vagina for 6 weeks  ? Walk with assistance   Complete by: As directed ?  ? Pt encouraged to walk once every 1-2 hours while awake.  ? ?  ? ?Allergies as of 10/18/2021   ?No Known Allergies ?  ? ?  ?Medication List  ?  ? ?TAKE these medications   ? ?albuterol 108 (90 Base) MCG/ACT inhaler ?Commonly known as: VENTOLIN HFA ?Inhale 1-2 puffs into the lungs every 6 (six) hours as needed for wheezing or shortness of breath. ?  ?aspirin EC 81 MG tablet ?Take 1 tablet (81 mg total) by mouth daily. Take after 12 weeks for prevention of preeclampsia later in pregnancy ?  ?docusate sodium 100 MG capsule ?Commonly known as: COLACE ?Take 1 capsule (100 mg total) by mouth 2 (two) times daily. ?  ?ibuprofen 400 MG tablet ?Commonly known as: ADVIL ?Take 1 tablet (400 mg total) by mouth 4 (four) times daily as needed. ?  What changed:  ?medication strength ?when to take this ?reasons to take this ?  ?loratadine 10 MG tablet ?Commonly known as: CLARITIN ?Take 10 mg by mouth daily as needed for allergies. ?  ?oxyCODONE-acetaminophen 5-325 MG tablet ?Commonly known as: PERCOCET/ROXICET ?Take 0.5 tablets by mouth every 4 (four) hours as needed for moderate pain. ?  ? ?  ? ?  ?  ? ? ?  ?Discharge Care Instructions  ?(From admission, onward)  ?  ? ? ?  ? ?  Start     Ordered  ? 10/18/21 0000  Discharge wound care:       ?Comments: Remove outer honeycomb dressing in  4 days  ? 10/18/21 1622  ? ?  ?  ? ?  ? ? Follow-up Information   ? ? [facility] up in 2 week(s).   ?Specialty: Internal Medicine ?Contact information: ?[address], [address] ?[place] ?[phone] ? ?  ?  ? ?  ?  ? ?  ? ? ?Signed: Lavonia Drafts ?10/18/2021, 4:24 PM ? ? ?

## 2021-10-18 NOTE — Telephone Encounter (Signed)
Left message on VM informing patient of Postop appointments scheduled on 11/08/2021 at 3:50pm and 11/29/2021 at 2:50pm.  Asked patient to contact our office with any questions or concerns. ?

## 2021-10-18 NOTE — Progress Notes (Signed)
1 Day Post-Op Procedure(s) (LRB): ?HYSTERECTOMY ABDOMINAL WITH SALPINGECTOMY (Bilateral) ? ?Subjective: ?Cindy Goodwin seen early this am at [facility]. Patient reports tolerating PO, + flatus, and no problems voiding.  She does report some nausea last evening but, has a small amount of fish, jello and other solid food. Her nausea is resolved at preset. Cindy Goodwin denies dizziness or SOB. She has been mobile in the bed walking around on the mattress and she has been walking in place on her motorized chair. Her pain is currently adequate.  ? ?Objective: ?I have reviewed patient's vital signs, intake and output, medications, and labs. ?BP (!) 102/40   Pulse (!) 113   Temp 99.1 ?F (37.3 ?C) (Oral)   Resp 18   Ht '2\' 4"'$  (0.711 m)   Wt 34.9 kg   LMP 10/09/2021 (Exact Date)   SpO2 99%   BMI 69.05 kg/m?  ?I/O last 3 completed shifts: ?In: 972.2 [P.O.:300; I.V.:672.2] ?Out: 475 [Urine:325; Blood:150] ?No intake/output data recorded. ? Cindy Goodwin has line (IJ) on the right side of her neck. The dressing is dry and there are no issues with the port site.   ?General: alert, cooperative, and no distress ?Resp: clear to auscultation bilaterally ?Cardio: regular rate and rhythm, S1, S2 normal, no murmur, click, rub or gallop ?GI: soft, non-tender; bowel sounds normal; no masses,  no organomegaly ?Extremities: no change in baseline.  Cindy Goodwin is lying in bed, moving legs for exercise without difficulty . ?Vaginal Bleeding: none ?Incision: honeycomb intact. There is a small amount of blood on the dressing that is stable from last evening.   ?CBC Latest Ref Rng & Units 10/18/2021 10/11/2021 10/11/2012  ?WBC 4.0 - 10.5 K/uL 9.7 4.2 8.1  ?Hemoglobin 12.0 - 15.0 g/dL 7.1(L) 9.7(L) 11.1(L)  ?Hematocrit 36.0 - 46.0 % 23.2(L) 31.0(L) 33.0(L)  ?Platelets 150 - 400 K/uL 361 374 PLATELET CLUMPS NOTED ON SMEAR, COUNT APPEARS ADEQUATE  ?  ?Assessment: ?s/p Procedure(s): ?HYSTERECTOMY ABDOMINAL WITH SALPINGECTOMY (Bilateral): stable, progressing well, tolerating diet, and anemia.  Pts anemia is not symptomatic. Her intraop blood loss appropriate for this case. I have discussed with her s/sx that might lead [place] to suggest a transfusion. I do not request this at this time.  She has no sx and has no active bleeding.   ? ?Plan: ?Advance diet ?Encourage ambulation ?Will give dose of Lovenox. I have discussed with Cindy Goodwin and her friend the need to decrease DVT and the importance of mobilization.   ?Cindy Goodwin is interested in discharge later today.  ?If Cindy Goodwin cont to do well and is able to tol breakfast and lunch without N/V, she may be discharge later today.  ? LOS: 1 day  ? ? ?Cindy Goodwin ?10/18/2021, 12:30 PM ? ? ? ? ?

## 2021-10-19 ENCOUNTER — Telehealth: Payer: Self-pay | Admitting: Obstetrics & Gynecology

## 2021-10-19 DIAGNOSIS — D62 Acute posthemorrhagic anemia: Secondary | ICD-10-CM

## 2021-10-19 LAB — SURGICAL PATHOLOGY

## 2021-10-19 NOTE — Telephone Encounter (Signed)
TC to pt to check on her post op. No answer. Left message to call back.  ? ?Dr. Ihor Dow  ?

## 2021-10-23 ENCOUNTER — Telehealth: Payer: Self-pay

## 2021-10-23 ENCOUNTER — Telehealth: Payer: Self-pay | Admitting: General Practice

## 2021-10-23 NOTE — Telephone Encounter (Signed)
Patient called and left message on nurse voicemail line stating she has questions about her recovery and would like a refill.  ?

## 2021-10-23 NOTE — Telephone Encounter (Signed)
Pt called stating she had an abdominal hysterectomy on 10/17/21. Pt states she is having a burning sensation at her incision site and she states she needs a refill of Oxycodone. I explained to pt that the burning sensation is likely because ligaments were cut on the inside. Pt made aware that a message will be sent to the provider about her refill. Understanding was voiced. ?English Tomer l Alden Feagan, CMA  ?

## 2021-10-24 NOTE — Telephone Encounter (Signed)
Patient requesting more pain medication. Pain at surgical site and feels like it is burning.  ?Patient also left message over the weekend with after hours line and they did not refill any pain medication for patient. Kathrene Alu RN  ?

## 2021-10-25 ENCOUNTER — Telehealth: Payer: Self-pay | Admitting: Obstetrics & Gynecology

## 2021-10-25 NOTE — Telephone Encounter (Signed)
TC to pt. Pt reported that she was having some pain. She reports that she is doing well. She is eating and drinking and walking a lot. She reports some pain. She has taken all of her oxycodone. She has been taking NSAIDS q4 hours. She reports that the Motrin alone was not helping with the pain. The pain is worse with walking etc.  ? ?The last nights she did not take the narcs. Hot showers make sx best.   ? ?All questions answered.  ? ?Mayerly Kaman L. Harraway-Smith, M.D., [place] ?      ?

## 2021-11-07 ENCOUNTER — Telehealth: Payer: Self-pay

## 2021-11-07 NOTE — Telephone Encounter (Signed)
Pt called stating she is having left sided nerve/gas pain that radiates to her shoulder. A message will be sent to the provider. ?Kahli Fitzgerald l Diem Dicocco, CMA  ?

## 2021-11-07 NOTE — Telephone Encounter (Signed)
-----   Message from Maurine Minister, [place] sent at 11/07/2021 11:32 AM EDT ----- ?Regarding: Request Call Back ?Has questions about pain that she is having. ? ?

## 2021-11-08 ENCOUNTER — Ambulatory Visit (INDEPENDENT_AMBULATORY_CARE_PROVIDER_SITE_OTHER): Payer: Medicaid Other | Admitting: Obstetrics & Gynecology

## 2021-11-08 ENCOUNTER — Encounter: Payer: Self-pay | Admitting: Obstetrics & Gynecology

## 2021-11-08 VITALS — BP 107/74 | HR 118 | Wt <= 1120 oz

## 2021-11-08 DIAGNOSIS — Z9889 Other specified postprocedural states: Secondary | ICD-10-CM

## 2021-11-08 NOTE — Progress Notes (Addendum)
History:  ?36 y.o.LMP here today for 2 week post op check.Pt is s/p TAH bilateral salpingectomy on 10/17/2020.  Pt reports that she is doing well. She is eating and passing stools without difficulty but, pt reports stool only every 1- 1 1/2days. She was previously having BMs after each meal. She report gas pains. She has not been ambulating as much as immediately post surgery.    ? ?The following portions of the patient's history were reviewed and updated as appropriate: allergies, current medications, past family history, past medical history, past social history, past surgical history and problem list. ? ?Review of Systems:  ?Pertinent items are noted in HPI. ?   ?Objective:  ?Physical Exam ?BP 107/74   Pulse (!) 118   Wt 65 lb (29.5 kg)   LMP 10/09/2021 (Exact Date)   BMI 58.29 kg/m?  ? ?CONSTITUTIONAL: Well-developed, well-nourished female in no acute distress.  ?HENT:  Normocephalic, atraumatic ?EYES: Conjunctivae and EOM are normal. No scleral icterus.  ?NECK: Normal range of motion ?SKIN: Skin is warm and dry. No rash noted. Not diaphoretic.No pallor. ?[place]: Alert and oriented to person, place, and time. Normal coordination.  ?Abd: Soft, nontender and nondistended; her incision is transverse: clean, dry and intact. She does have some numbness around the incision which is WNL.   ?Pelvic: deferred ? ?Labs and Imaging ?Surg path 10/17/2020 ?UTERUS, CERVIX, AND FALLOPIAN TUBES:  ?- Serosa: Unremarkable.  No neoplasm.  ?- Endometrium: Proliferative endometrium.  No hyperplasia or malignancy.  ?- Myometrium: Uterine leiomyomas, intramural.  No malignancy.  ?- Cervix: Unremarkable.  No atypia, dysplasia, or malignancy.  ?- Bilateral fallopian tubes: Unremarkable segments of fimbriated  ?fallopian tubes.  No dysplasia or malignancy.   ? ?Assessment & Plan:  ?2 week post op check following TAH with bilateral salpingectomy.  ? Doing well ? Reviewed her surg path.  ? Reviewed post op instructions and  activities ? Gradual increase in activities ? F/u in 4 weeks or sooner prn ? Reviewed no intercourse for 6 weeks post op ? Increase fiber in diet ? Increase ambulation ?Restart po iron ?   CBC at next visit ? All questions answered.  ? ?Fitzhugh Vizcarrondo L. Harraway-Smith, M.D., [place]  ?

## 2021-11-29 ENCOUNTER — Ambulatory Visit (INDEPENDENT_AMBULATORY_CARE_PROVIDER_SITE_OTHER): Payer: Medicaid Other | Admitting: Obstetrics & Gynecology

## 2021-11-29 ENCOUNTER — Encounter: Payer: Self-pay | Admitting: Obstetrics & Gynecology

## 2021-11-29 ENCOUNTER — Other Ambulatory Visit (HOSPITAL_COMMUNITY)
Admission: RE | Admit: 2021-11-29 | Discharge: 2021-11-29 | Disposition: A | Discharge status: Home / Self Care | Payer: Medicaid Other | Source: Ambulatory Visit | Attending: Obstetrics & Gynecology | Admitting: Obstetrics & Gynecology

## 2021-11-29 VITALS — Wt <= 1120 oz

## 2021-11-29 DIAGNOSIS — N898 Other specified noninflammatory disorders of vagina: Secondary | ICD-10-CM | POA: Diagnosis present

## 2021-11-29 DIAGNOSIS — Z9889 Other specified postprocedural states: Secondary | ICD-10-CM

## 2021-11-29 NOTE — Progress Notes (Signed)
History:  ?36 y.o.LMP here today for 6 week post op check.Pt is s/p TAH with bilateral salpingectomy on 10/17/2021.  Pt reports that she is doing well. She is eating and passing stools without difficulty.  She did have sx related to her prev cycle with a change in the amount of discharge.    ? ?The following portions of the patient's history were reviewed and updated as appropriate: allergies, current medications, past family history, past medical history, past social history, past surgical history and problem list. ? ?Review of Systems:  ?Pertinent items are noted in HPI. ?   ?Objective:  ?Physical Exam ?Wt 65 lb (29.5 kg)   BMI 58.29 kg/m?  ?CONSTITUTIONAL: Well-developed, well-nourished female in no acute distress.  ?HENT:  Normocephalic, atraumatic ?EYES: Conjunctivae and EOM are normal. No scleral icterus.  ?NECK: Normal range of motion ?SKIN: Skin is warm and dry. No rash noted. Not diaphoretic.No pallor. ?[place]: Alert and oriented to person, place, and time. Normal coordination.  ?Abd: Soft, nontender and nondistended; her port sites are healing well. Marland Kitchen  ?Pelvic: cuff is well healed. There is an excessive amount of discharge that looks to be physiologic.    ? ?Labs and Imaging ?Surg path 10/17/2021 ?FINAL MICROSCOPIC DIAGNOSIS:  ? ?A. UTERUS, CERVIX, AND FALLOPIAN TUBES:  ?- Serosa: Unremarkable.  No neoplasm.  ?- Endometrium: Proliferative endometrium.  No hyperplasia or malignancy.  ?- Myometrium: Uterine leiomyomas, intramural.  No malignancy.  ?- Cervix: Unremarkable.  No atypia, dysplasia, or malignancy.  ?- Bilateral fallopian tubes: Unremarkable segments of fimbriated  ?fallopian tubes.  No dysplasia or malignancy.   ? ?Assessment & Plan:  ?6 week post op check following TAH with bilateral salpingectomy.  ? Doing well ? Reviewed her surg path.  ? Reviewed post op instructions and activities ? Gradual increase in activities ? F/u in 3 months sooner prn ? May return to full activity ?Affirm sent to eval  discharge.   ? All questions answered.  ? ?Icy Fuhrmann L. Harraway-Smith, M.D., [place]  ?

## 2021-12-01 LAB — CERVICOVAGINAL ANCILLARY ONLY
Bacterial Vaginitis (gardnerella): NEGATIVE
Candida Glabrata: POSITIVE — AB
Candida Vaginitis: NEGATIVE
Comment: NEGATIVE
Comment: NEGATIVE
Comment: NEGATIVE

## 2021-12-04 ENCOUNTER — Other Ambulatory Visit: Payer: Self-pay

## 2021-12-04 DIAGNOSIS — B379 Candidiasis, unspecified: Secondary | ICD-10-CM

## 2021-12-04 MED ORDER — FLUCONAZOLE 100 MG PO TABS: 100.0000 mg | ORAL_TABLET | Freq: Every day | ORAL | 0 refills | Status: DC

## 2021-12-04 NOTE — Progress Notes (Signed)
Patient made aware that she has a yeast infection. Per Dr. Ihor Dow, ok to send in Diflucan 100 mg. Diflucan 100 mg 1 tablet PO once was sent to her pharmacy. Understanding was voiced. ?Aitana Burry l Berdell Nevitt, CMA  ?

## 2022-01-30 IMAGING — US US PELVIS COMPLETE
1 series · 14 of 25 positions shown · non-contrast
Comparison: None

CLINICAL DATA: Uterine fibroids, history of diastrophic dysplasia

EXAM:
TRANSABDOMINAL ULTRASOUND OF PELVIS
TECHNIQUE: Transabdominal ultrasound examination of the pelvis was performed
including evaluation of the uterus, ovaries, adnexal regions, and
pelvic cul-de-sac. Transvaginal imaging was not performed.

[Series 1: us pelvis complete · 50 acquisitions, 14 frames shown]
[im 1/50]
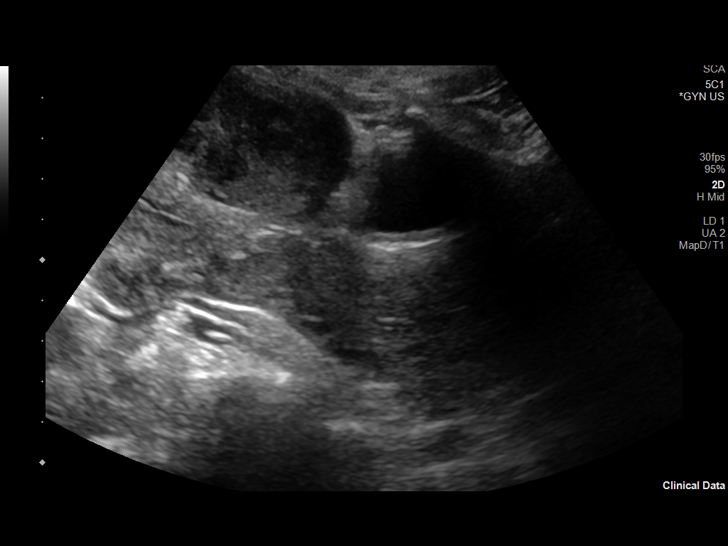
[im 5/50]
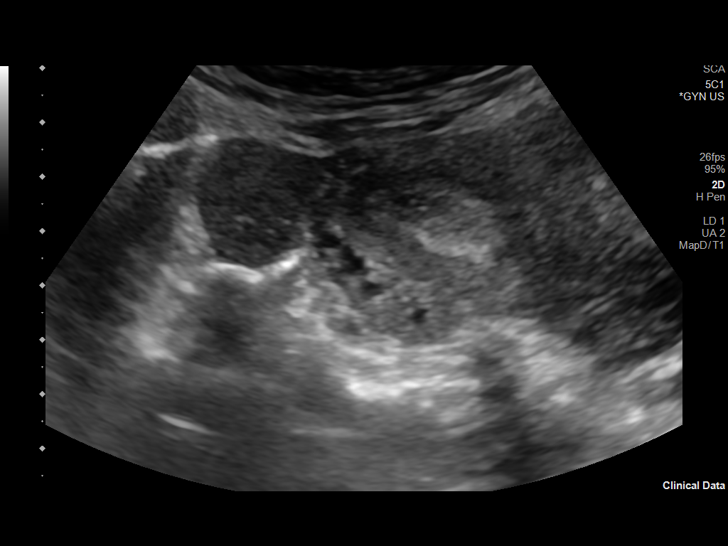
[im 9/50]
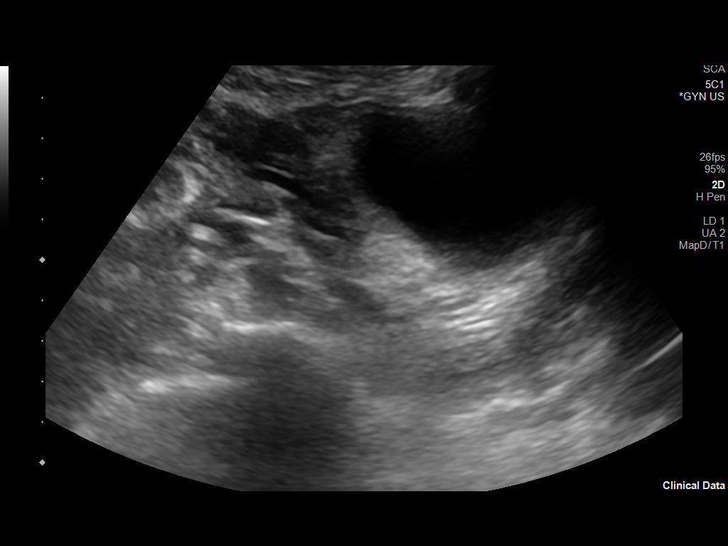
[im 13/50]
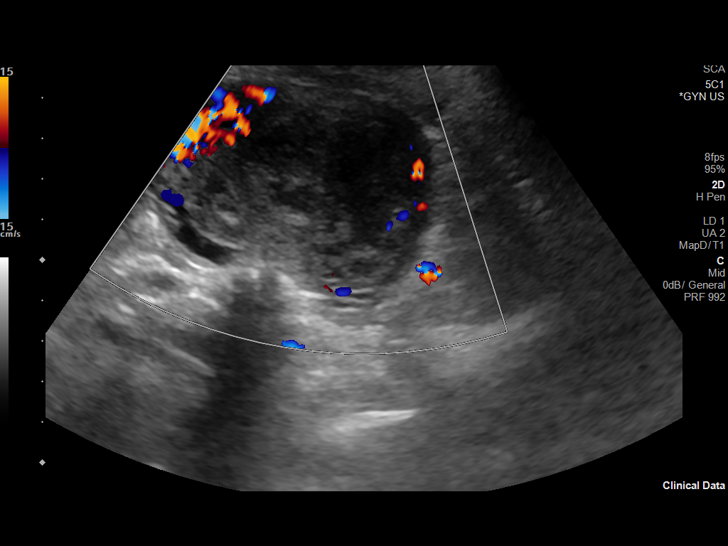
[im 17/50]
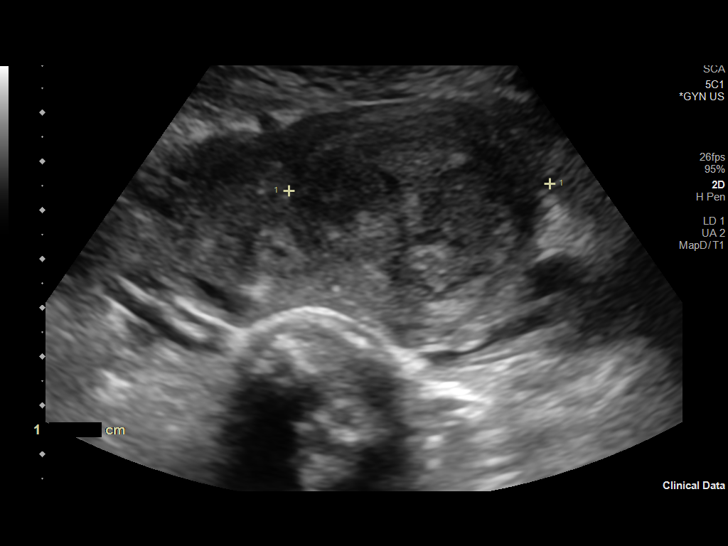
[im 19/50]
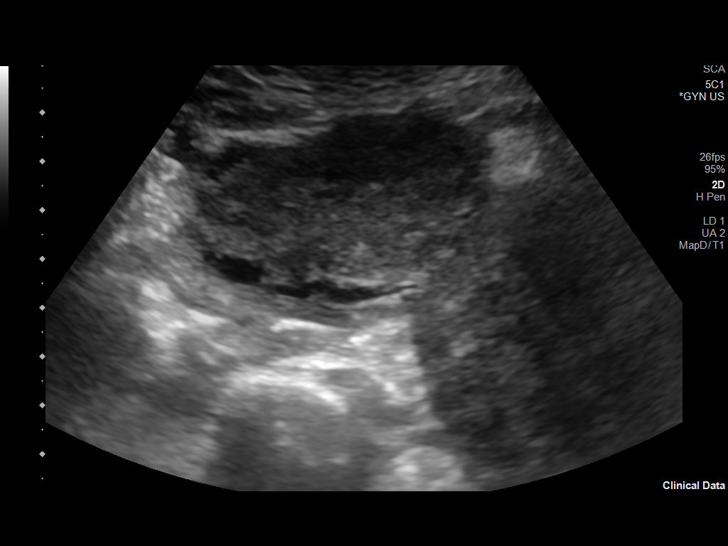
[im 23/50]
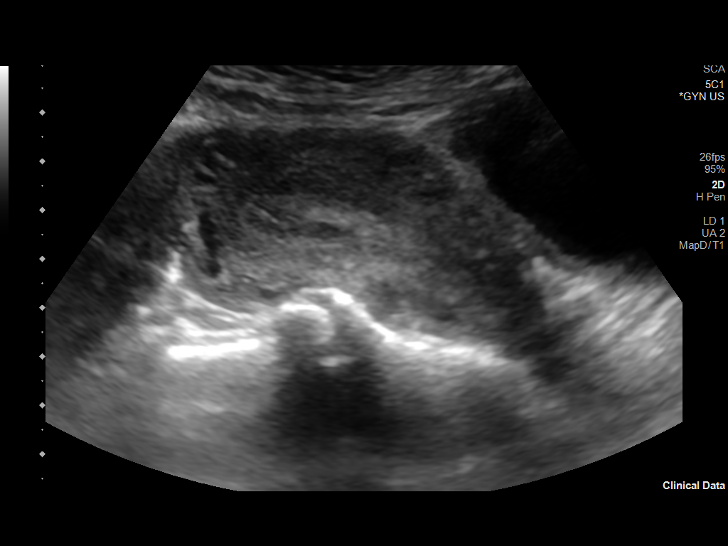
[im 27/50]
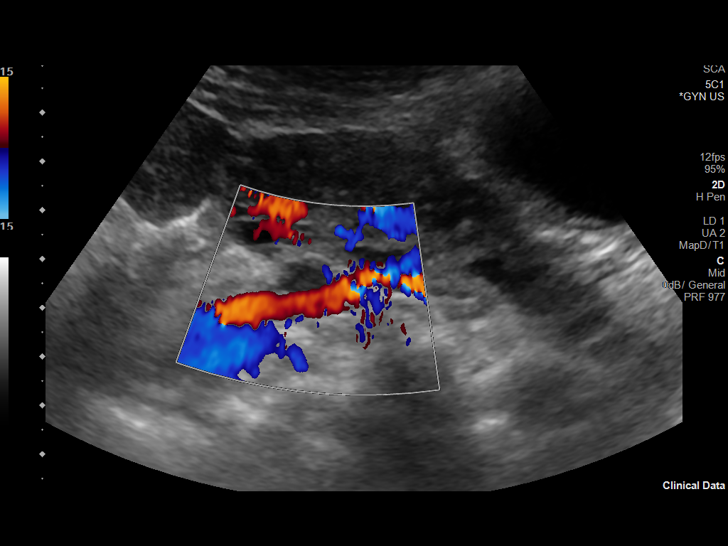
[im 31/50]
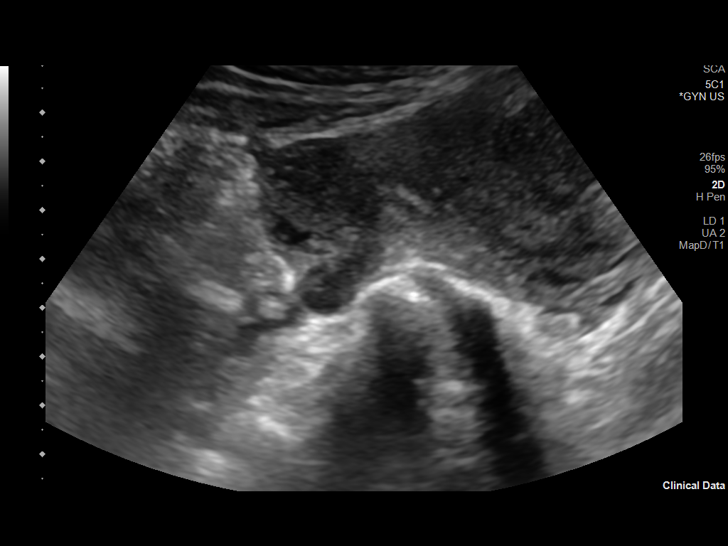
[im 33/50]
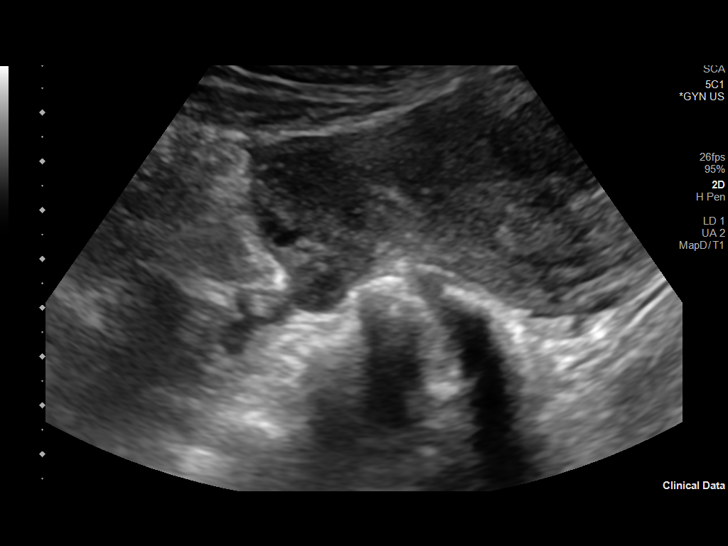
[im 37/50]
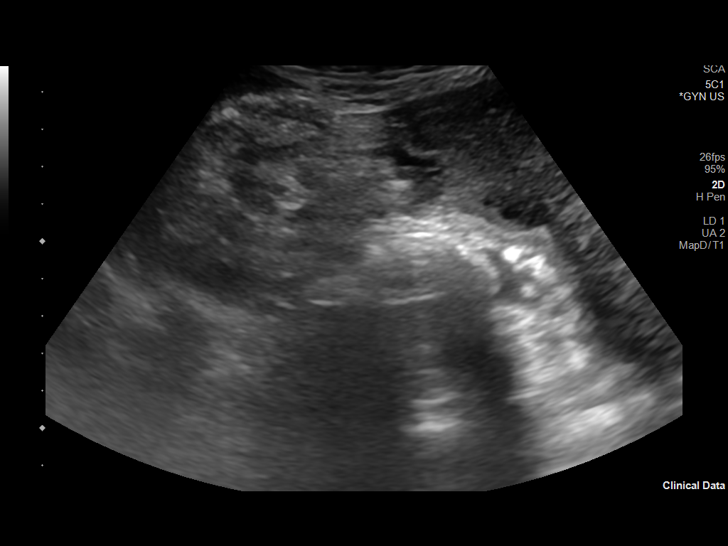
[im 41/50]
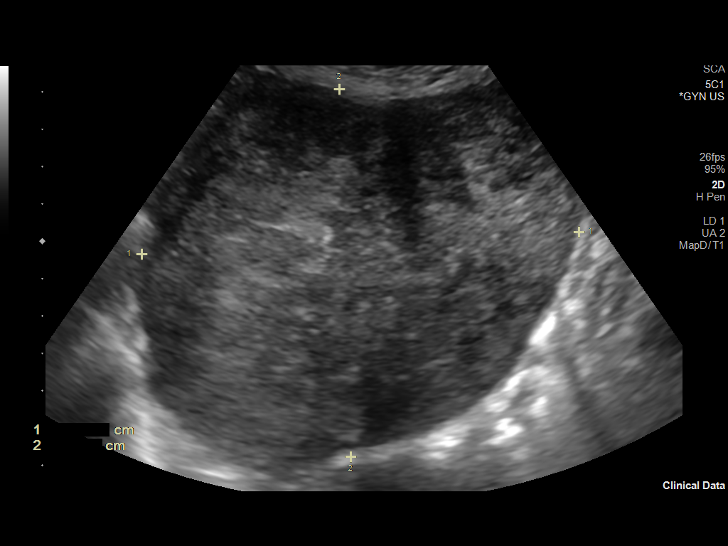
[im 45/50]
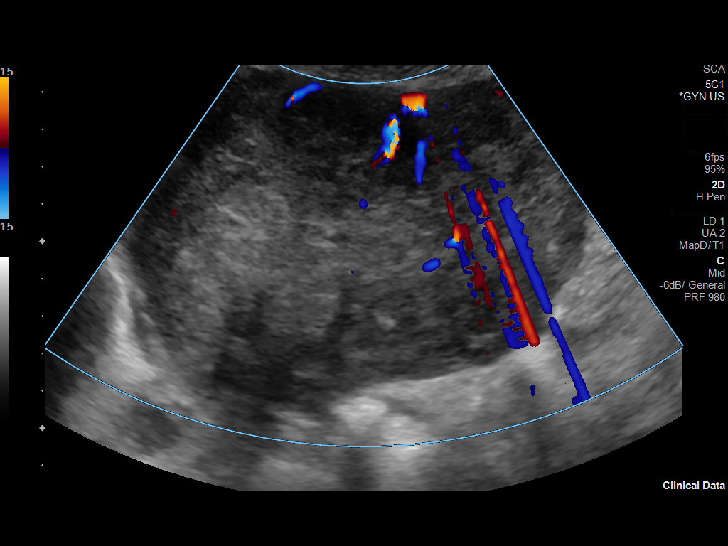
[im 50/50]
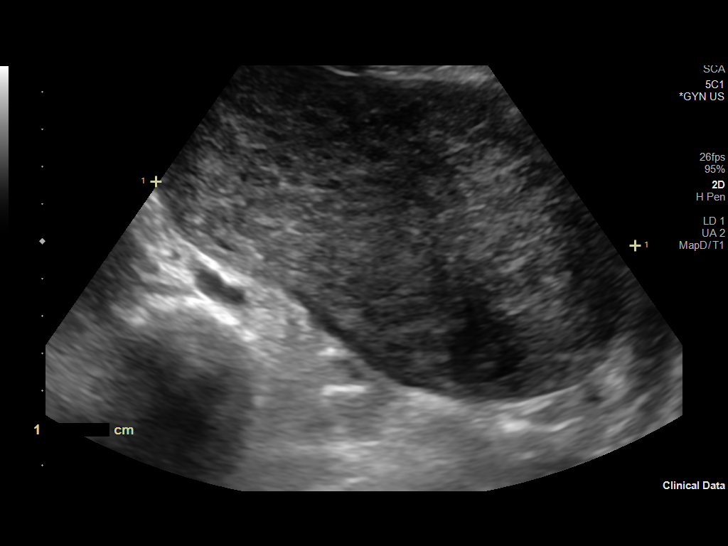

[14 of 25 positions shown; findings below may reference images not displayed]

FINDINGS: Uterus

Measurements: 10.6 x 3.7 x 6.8 cm = volume: 142 mL. Anteverted.
Exophytic leiomyoma at fundus 2.1 x 2.3 x 2.0 cm. Additional large
anterior LEFT leiomyoma exophytic 13.3 x 9.8 x 12.9 cm. Third
exophytic leiomyoma anterior slightly LEFT uterus 6.9 x 5.6 x
cm.

Endometrium

Thickness: 8 mm. Small amount of nonspecific endometrial fluid. No
focal mass.

Right ovary

Measurements: 1.5 x 1.4 x 1.3 cm = volume: 1.3 mL. Normal morphology
without mass

Left ovary

Obscured due to presence of bowel loops and exophytic LEFT-sided
leiomyomata.

Other findings:  No free pelvic fluid or additional pelvic masses.
IMPRESSION: Nonvisualization of LEFT ovary.

Three exophytic leiomyomata, largest 13.3 cm in greatest size
extending LEFT.

## 2022-02-28 ENCOUNTER — Encounter: Payer: Self-pay | Admitting: Obstetrics & Gynecology

## 2022-02-28 ENCOUNTER — Ambulatory Visit (INDEPENDENT_AMBULATORY_CARE_PROVIDER_SITE_OTHER): Payer: Medicaid Other | Admitting: Obstetrics & Gynecology

## 2022-02-28 VITALS — BP 97/54 | HR 99

## 2022-02-28 DIAGNOSIS — D251 Intramural leiomyoma of uterus: Secondary | ICD-10-CM

## 2022-02-28 DIAGNOSIS — D250 Submucous leiomyoma of uterus: Secondary | ICD-10-CM | POA: Diagnosis not present

## 2022-02-28 NOTE — Progress Notes (Signed)
History:  36 y.o.LMP here today for 3 months post op check.Pt is s/p TAH with bilateral salpingectomy on 10/17/2021.  Pt reports that she is doing well. No problems noted. Her incision itching occ but is flat.     The following portions of the patient's history were reviewed and updated as appropriate: allergies, current medications, past family history, past medical history, past social history, past surgical history and problem list.  Review of Systems:  Pertinent items are noted in HPI.    Objective:  Physical Exam BP (!) 97/54   Pulse 99   LMP 10/09/2021 (Exact Date)   CONSTITUTIONAL: Well-developed, well-nourished female in no acute distress.  HENT:  Normocephalic, atraumatic EYES: Conjunctivae and EOM are normal. No scleral icterus.  NECK: Normal range of motion SKIN: Skin is warm and dry. No rash noted. Not diaphoretic.No pallor. [place]: Alert and oriented to person, place, and time. Normal coordination.  Abd: Soft, nontender and nondistended; her port sites are healing well. .  Pelvic: deferred  Assessment & Plan:  3 month post op check following TAH with bilateral salpingectomy.   Doing well  Reviewed her surg path.   Reviewed post op instructions and activities  Gradual increase in activities  F/u in 4 weeks or sooner prn  Reviewed no intercourse for 8 weeks post op  All questions answered.   Jesalyn Finazzo L. Harraway-Smith, M.D., [name]

## 2022-04-03 ENCOUNTER — Telehealth: Payer: Self-pay

## 2022-04-03 NOTE — Telephone Encounter (Signed)
Patient called and wanted to share symptoms to see if they could be related to her hysterectomy (done in March 2023). Patient states that she has pain in upper abdomen that radiated to back and "feel like gas trapped". Patient then states that it is worse after eating and having some nausea. Patient made aware that this sounds more gallbladder related and to make a call to her primary care. Patient states that she is waiting on a call back from them. Anderson Malta [facility]

## 2022-04-04 ENCOUNTER — Ambulatory Visit
Admission: EM | Admit: 2022-04-04 | Discharge: 2022-04-04 | Disposition: A | Discharge status: Home / Self Care | Payer: Medicaid Other | Attending: Urgent Care | Admitting: Urgent Care

## 2022-04-04 ENCOUNTER — Encounter: Payer: Self-pay | Admitting: Emergency Medicine

## 2022-04-04 DIAGNOSIS — R1012 Left upper quadrant pain: Secondary | ICD-10-CM | POA: Diagnosis not present

## 2022-04-04 DIAGNOSIS — G8929 Other chronic pain: Secondary | ICD-10-CM | POA: Diagnosis not present

## 2022-04-04 DIAGNOSIS — R1013 Epigastric pain: Secondary | ICD-10-CM | POA: Diagnosis not present

## 2022-04-04 DIAGNOSIS — Z90710 Acquired absence of both cervix and uterus: Secondary | ICD-10-CM

## 2022-04-04 MED ORDER — FAMOTIDINE 20 MG PO TABS: 20.0000 mg | ORAL_TABLET | Freq: Two times a day (BID) | ORAL | 0 refills | Status: AC

## 2022-04-04 NOTE — ED Notes (Signed)
Unable to obtain blood work within 2 attempts. Pt will attempt [facility] collection.

## 2022-04-04 NOTE — ED Provider Notes (Signed)
[facility]   MRN: [id number] DOB: [birth year]  Subjective:   Cindy Goodwin is a 36 y.o. female presenting for 1 week history of persistent intermittent epigastric pain, left upper abdominal pain.  Symptoms started after she eats food.  Thereafter the pain comes on and can radiate to the back and into her chest.  Has had slight nausea.  No diarrhea, constipation, vomiting, shortness of breath.  No history of gallbladder disease, pancreatitis.  Has a rare alcohol drink.  Patient tries to eat very healthily.  Past medical history is significant for history of fibroids and is status post total hysterectomy with salpingectomy.  No current facility-administered medications for this encounter.  Current Outpatient Medications:    albuterol (PROVENTIL HFA;VENTOLIN HFA) 108 (90 Base) MCG/ACT inhaler, Inhale 1-2 puffs into the lungs every 6 (six) hours as needed for wheezing or shortness of breath., Disp: 18 g, Rfl: 1   aspirin EC 81 MG tablet, Take 1 tablet (81 mg total) by mouth daily. Take after 12 weeks for prevention of preeclampsia later in pregnancy, Disp: 300 tablet, Rfl: 0   docusate sodium (COLACE) 100 MG capsule, Take 1 capsule (100 mg total) by mouth 2 (two) times daily. (Patient not taking: Reported on 02/28/2022), Disp: 10 capsule, Rfl: 0   fluconazole (DIFLUCAN) 100 MG tablet, Take 1 tablet (100 mg total) by mouth daily. (Patient not taking: Reported on 02/28/2022), Disp: 1 tablet, Rfl: 0   ibuprofen (ADVIL) 400 MG tablet, Take 1 tablet (400 mg total) by mouth 4 (four) times daily as needed. (Patient not taking: Reported on 11/29/2021), Disp: 30 tablet, Rfl: 1   loratadine (CLARITIN) 10 MG tablet, Take 10 mg by mouth daily as needed for allergies., Disp: , Rfl:    oxyCODONE-acetaminophen (PERCOCET/ROXICET) 5-325 MG tablet, Take 0.5 tablets by mouth every 4 (four) hours as needed for moderate pain. (Patient not taking: Reported on 11/29/2021), Disp: 15 tablet, Rfl: 0    No Known Allergies  Past Medical History:  Diagnosis Date   Diastrophic dysplasia    Kyphosis    Mild persistent asthma 12/31/2017   Palpitations    Scoliosis      Past Surgical History:  Procedure Laterality Date   HYSTERECTOMY ABDOMINAL WITH SALPINGECTOMY Bilateral 10/17/2021   Procedure: HYSTERECTOMY ABDOMINAL WITH SALPINGECTOMY;  Surgeon: Lavonia Drafts, MD;  Location: [facility];  Service: Gynecology;  Laterality: Bilateral;   NO PAST SURGERIES      Family History  Problem Relation Age of Onset   Allergic rhinitis Maternal Aunt    Allergic rhinitis Maternal Uncle    Cancer Maternal Grandmother    Diabetes Neg Hx    Hypertension Neg Hx     Social History   Tobacco Use   Smoking status: Never   Smokeless tobacco: Never  Vaping Use   Vaping Use: Never used  Substance Use Topics   Alcohol use: Yes    Comment: occasional   Drug use: No    ROS   Objective:   Vitals: BP 111/76   Pulse (!) 107   Temp 98.5 F (36.9 C)   Resp 18   LMP 10/09/2021 (Exact Date)   SpO2 98%   Physical Exam Constitutional:      General: She is not in acute distress.    Appearance: Normal appearance. She is well-developed. She is not ill-appearing, toxic-appearing or diaphoretic.  HENT:     Head: Normocephalic and atraumatic.     Nose: Nose normal.     Mouth/Throat:  Mouth: Mucous membranes are moist.  Eyes:     General: No scleral icterus.       Right eye: No discharge.        Left eye: No discharge.     Extraocular Movements: Extraocular movements intact.     Conjunctiva/sclera: Conjunctivae normal.  Cardiovascular:     Rate and Rhythm: Normal rate and regular rhythm.     Heart sounds: Normal heart sounds. No murmur heard.    No friction rub. No gallop.  Pulmonary:     Effort: Pulmonary effort is normal. No respiratory distress.     Breath sounds: No stridor. No wheezing, rhonchi or rales.  Chest:     Chest wall: No tenderness.  Abdominal:     General:  Bowel sounds are normal. There is no distension.     Palpations: Abdomen is soft. There is no mass.     Tenderness: There is abdominal tenderness in the epigastric area and left upper quadrant. There is no right CVA tenderness, left CVA tenderness, guarding or rebound.  Skin:    General: Skin is warm and dry.  Neurological:     General: No focal deficit present.     Mental Status: She is alert and oriented to person, place, and time.  Psychiatric:        Mood and Affect: Mood normal.        Behavior: Behavior normal.        Thought Content: Thought content normal.        Judgment: Judgment normal.     Assessment and Plan :   PDMP not reviewed this encounter.  1. Abdominal pain, epigastric   2. History of hysterectomy   3. Abdominal pain, left upper quadrant   4. Abdominal pain, chronic, epigastric    Patient will pursue blood work through [facility] as we were unable to obtain a sample despite multiple attempts in clinic.  In the meantime, recommended famotidine and GERD friendly foods.  Low suspicion for an acute abdomen, pancreatitis.  Follow-up with PCP as soon as possible.  Counseled patient on potential for adverse effects with medications prescribed/recommended today, ER and return-to-clinic precautions discussed, patient verbalized understanding.    Jaynee Eagles, [place] 04/04/22 1337

## 2022-04-04 NOTE — ED Triage Notes (Addendum)
Pt here with epigastric pain x 1 week that is worse with first bite of food that radiates to her back and up her chest. Pt had some light nausea when it began a week ago. Pt states the pain is generally worse at night.

## 2022-04-06 LAB — CBC WITH DIFFERENTIAL/PLATELET
Basophils Absolute: 0.1 10*3/uL (ref 0.0–0.2)
Basos: 1 %
EOS (ABSOLUTE): 0 10*3/uL (ref 0.0–0.4)
Eos: 0 %
Hematocrit: 36.2 % (ref 34.0–46.6)
Hemoglobin: 11.4 g/dL (ref 11.1–15.9)
Immature Grans (Abs): 0 10*3/uL (ref 0.0–0.1)
Immature Granulocytes: 0 %
Lymphocytes Absolute: 1.9 10*3/uL (ref 0.7–3.1)
Lymphs: 30 %
MCH: 24.9 pg — ABNORMAL LOW (ref 26.6–33.0)
MCHC: 31.5 g/dL (ref 31.5–35.7)
MCV: 79 fL (ref 79–97)
Monocytes Absolute: 0.6 10*3/uL (ref 0.1–0.9)
Monocytes: 9 %
Neutrophils Absolute: 3.9 10*3/uL (ref 1.4–7.0)
Neutrophils: 60 %
Platelets: 310 10*3/uL (ref 150–450)
RBC: 4.58 x10E6/uL (ref 3.77–5.28)
RDW: 18.8 % — ABNORMAL HIGH (ref 11.7–15.4)
WBC: 6.5 10*3/uL (ref 3.4–10.8)

## 2022-04-06 LAB — COMPREHENSIVE METABOLIC PANEL
ALT: 6 IU/L (ref 0–32)
AST: 20 IU/L (ref 0–40)
Albumin/Globulin Ratio: 1.4 (ref 1.2–2.2)
Albumin: 4.4 g/dL (ref 3.9–4.9)
Alkaline Phosphatase: 42 IU/L — ABNORMAL LOW (ref 44–121)
BUN/Creatinine Ratio: 30 — ABNORMAL HIGH (ref 9–23)
BUN: 8 mg/dL (ref 6–20)
Bilirubin Total: 0.3 mg/dL (ref 0.0–1.2)
CO2: 20 mmol/L (ref 20–29)
Calcium: 10.1 mg/dL (ref 8.7–10.2)
Chloride: 102 mmol/L (ref 96–106)
Creatinine, Ser: 0.27 mg/dL — ABNORMAL LOW (ref 0.57–1.00)
Globulin, Total: 3.2 g/dL (ref 1.5–4.5)
Glucose: 78 mg/dL (ref 70–99)
Potassium: 4.2 mmol/L (ref 3.5–5.2)
Sodium: 139 mmol/L (ref 134–144)
Total Protein: 7.6 g/dL (ref 6.0–8.5)
eGFR: 144 mL/min/{1.73_m2} (ref >59)

## 2022-04-06 LAB — LIPASE: Lipase: 39 U/L (ref 14–72)

## 2022-08-21 IMAGING — DX DG CHEST 1V PORT
1 series · 1 of 1 positions shown · non-contrast
Comparison: Prior chest radiographs 12/31/2017 and earlier.

CLINICAL DATA: Central line insertion site infection, initial
encounter.

EXAM:
PORTABLE CHEST 1 VIEW

[chest]
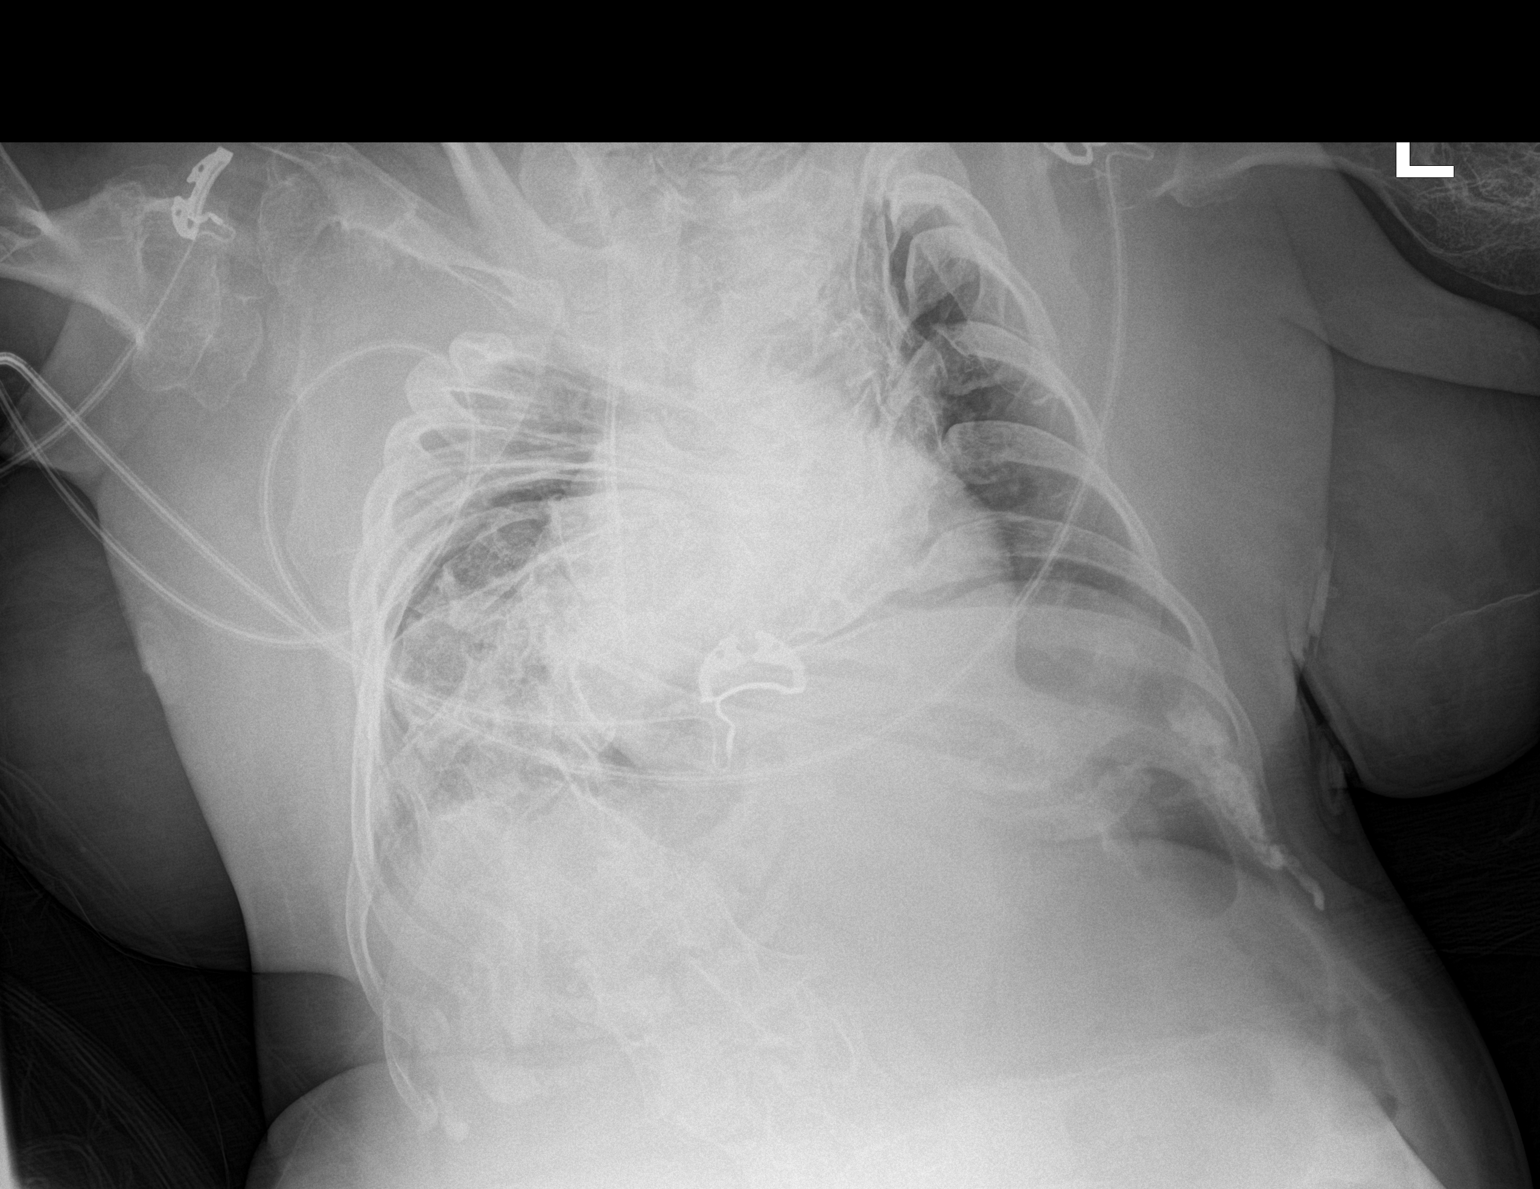

[1 of 1 positions shown; findings below may reference images not displayed]

FINDINGS: Right IJ approach central venous catheter, with tip projecting at
the level of the mid right atrium. Cardiomegaly. Evaluation of the
lungs is limited by a severe scoliosis and chronic deformity of the
thoracic cage. Within this limitation, there is no appreciable
airspace consolidation, pleural effusion or pneumothorax. Shortened
appearance of the humeri.
IMPRESSION: Right IJ approach central venous catheter with tip projecting at the
level of the mid right atrium.

Cardiomegaly.

Chronic skeletal findings, as described.

## 2022-09-12 ENCOUNTER — Ambulatory Visit (HOSPITAL_COMMUNITY)
Admission: RE | Admit: 2022-09-12 | Discharge: 2022-09-12 | Disposition: A | Discharge status: Home / Self Care | Payer: Medicaid Other | Source: Ambulatory Visit | Attending: Family Medicine | Admitting: Family Medicine

## 2022-09-12 ENCOUNTER — Encounter (HOSPITAL_COMMUNITY): Payer: Self-pay

## 2022-09-12 ENCOUNTER — Telehealth (HOSPITAL_COMMUNITY): Payer: Self-pay

## 2022-09-12 VITALS — BP 98/59 | HR 91 | Temp 98.1°F | Resp 16

## 2022-09-12 DIAGNOSIS — H60501 Unspecified acute noninfective otitis externa, right ear: Secondary | ICD-10-CM

## 2022-09-12 DIAGNOSIS — R109 Unspecified abdominal pain: Secondary | ICD-10-CM | POA: Diagnosis not present

## 2022-09-12 LAB — POCT URINALYSIS DIPSTICK, ED / UC
Bilirubin Urine: NEGATIVE
Glucose, UA: NEGATIVE mg/dL
Hgb urine dipstick: NEGATIVE
Ketones, ur: NEGATIVE mg/dL
Leukocytes,Ua: NEGATIVE
Nitrite: NEGATIVE
Protein, ur: NEGATIVE mg/dL
Specific Gravity, Urine: 1.02 (ref 1.005–1.030)
Urobilinogen, UA: 0.2 mg/dL (ref 0.0–1.0)
pH: 5.5 (ref 5.0–8.0)

## 2022-09-12 MED ORDER — DICYCLOMINE HCL 10 MG PO CAPS: 10.0000 mg | ORAL_CAPSULE | Freq: Two times a day (BID) | ORAL | 0 refills | Status: DC | PRN

## 2022-09-12 MED ORDER — OFLOXACIN 0.3 % OT SOLN: 5.0000 [drp] | Freq: Every day | OTIC | 0 refills | Status: AC

## 2022-09-12 MED ORDER — DICYCLOMINE HCL 10 MG PO CAPS: 10.0000 mg | ORAL_CAPSULE | Freq: Two times a day (BID) | ORAL | 0 refills | Status: AC | PRN

## 2022-09-12 MED ORDER — OFLOXACIN 0.3 % OT SOLN: 5.0000 [drp] | Freq: Every day | OTIC | 0 refills | Status: DC

## 2022-09-12 NOTE — ED Triage Notes (Signed)
Pt states lower abd pain radiating to her back for over a week.  Denies any urinary symptoms. Also states her right ear hurts.

## 2022-09-12 NOTE — ED Provider Notes (Signed)
[facility]    CSN: [id number] Arrival date & time: 09/12/22  1228      History   Chief Complaint Chief Complaint  Patient presents with   Abdominal Pain    HPI Cindy Goodwin is a 37 y.o. female.   Patient presents today with several concerns.  Her primary concern today is intermittent left abdominal pain.  Reports this is often triggered by eating and during episodes will be as severe as 5 on a 0-10 pain scale but then resolves without intervention.  She has not been taking any medication.  She does have a history of UTI and has had similar presentation with UTI in the past.  She denies any current urinary symptoms.  Denies any recent antibiotics.  She is eating and drinking normally.  Denies history of gastrointestinal disorder including diverticulitis.  She has had a hysterectomy but no additional abdominal surgeries.  She denies any current pain.  She reports having normal bowel movements with last bowel movement earlier today without pain or difficulty passing.  Denies any nausea, vomiting, diarrhea.  In addition, she reports pain and drainage from her right ear.  She has very narrow ear canals and often gets recurrent infections.  She actively attempts to avoid water exposure but is not always successful.  She is concerned that she got water in her ear and this contributed to her symptoms.  She has not tried any over-the-counter medications.  Denies any recent illness or additional symptoms.    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   Mild persistent asthma/cough variant asthma 12/31/2017   Chronic rhinitis 12/31/2017    Past Surgical History:  Procedure Laterality Date   HYSTERECTOMY ABDOMINAL WITH SALPINGECTOMY Bilateral 10/17/2021   Procedure: HYSTERECTOMY ABDOMINAL WITH SALPINGECTOMY;  Surgeon: Lavonia Drafts, MD;  Location: [facility];  Service: Gynecology;  Laterality: Bilateral;   NO PAST SURGERIES      OB History     Gravida  0   Para  0   Term  0   Preterm  0   AB  0   Living  0      SAB  0   IAB  0   Ectopic  0   Multiple  0   Live Births  0            Home Medications    Prior to Admission medications   Medication Sig Start Date End Date Taking? Authorizing Provider  dicyclomine (BENTYL) 10 MG capsule Take 1 capsule (10 mg total) by mouth 2 (two) times daily as needed for spasms. 09/12/22  Yes Tanica Gaige K, PA-C  ofloxacin (FLOXIN) 0.3 % OTIC solution Place 5 drops into the right ear daily. 09/12/22  Yes Polo Mcmartin K, PA-C  albuterol (PROVENTIL HFA;VENTOLIN HFA) 108 (90 Base) MCG/ACT inhaler Inhale 1-2 puffs into the lungs every 6 (six) hours as needed for wheezing or shortness of breath. 12/31/17   Bobbitt, Sedalia Muta, MD  aspirin EC 81  MG tablet Take 1 tablet (81 mg total) by mouth daily. Take after 12 weeks for prevention of preeclampsia later in pregnancy 10/18/21   Lavonia Drafts, MD  famotidine (PEPCID) 20 MG tablet Take 1 tablet (20 mg total) by mouth 2 (two) times daily. 04/04/22   Jaynee Eagles, PA-C  loratadine (CLARITIN) 10 MG tablet Take 10 mg by mouth daily as needed for allergies.    [provider]    Family History Family History  Problem Relation Age of Onset   Allergic rhinitis Maternal Aunt    Allergic rhinitis Maternal Uncle    Cancer Maternal Grandmother    Diabetes Neg Hx    Hypertension Neg Hx     Social History Social History   Tobacco Use   Smoking status: Never   Smokeless tobacco: Never   Vaping Use   Vaping Use: Never used  Substance Use Topics   Alcohol use: Yes    Comment: occasional   Drug use: No     Allergies   Patient has no known allergies.   Review of Systems Review of Systems  Constitutional:  Positive for activity change. Negative for appetite change, fatigue and fever.  HENT:  Positive for ear discharge, ear pain and hearing loss. Negative for congestion, sinus pressure, sneezing and sore throat.   Respiratory:  Negative for cough and shortness of breath.   Cardiovascular:  Negative for chest pain.  Gastrointestinal:  Positive for abdominal pain. Negative for blood in stool, constipation, diarrhea, nausea and vomiting.     Physical Exam Triage Vital Signs ED Triage Vitals  Enc Vitals Group     BP 09/12/22 1246 (!) 98/59     Pulse Rate 09/12/22 1246 91     Resp 09/12/22 1246 16     Temp 09/12/22 1246 98.1 F (36.7 C)     Temp Source 09/12/22 1246 Oral     SpO2 09/12/22 1246 98 %     Weight --      Height --      Head Circumference --      Peak Flow --      Pain Score 09/12/22 1249 5     Pain Loc --      Pain Edu? --      Excl. in [place]? --    No data found.  Updated Vital Signs BP (!) 98/59 (BP Location: Left Arm)   Pulse 91   Temp 98.1 F (36.7 C) (Oral)   Resp 16   LMP 10/09/2021 (Exact Date)   SpO2 98%   Visual Acuity Right Eye Distance:   Left Eye Distance:   Bilateral Distance:    Right Eye Near:   Left Eye Near:    Bilateral Near:     Physical Exam Vitals reviewed.  Constitutional:      General: She is awake. She is not in acute distress.    Appearance: Normal appearance. She is well-developed. She is not ill-appearing.     Comments: Very pleasant female appears stated age sitting in motorized wheelchair  HENT:     Head: Normocephalic and atraumatic.     Right Ear: Swelling present.     Ears:     Comments: Erythema noted at external ear canal.  Patient has significant stenosis of ear canal so difficult to  visualize TM completely; able to visualize approximately 10% that appears normal.    Nose: Nose normal.  Cardiovascular:     Rate and Rhythm: Normal rate and regular rhythm.  Heart sounds: Normal heart sounds, S1 normal and S2 normal. No murmur heard. Pulmonary:     Effort: Pulmonary effort is normal.     Breath sounds: Normal breath sounds. No wheezing, rhonchi or rales.  Abdominal:     General: Bowel sounds are normal.     Palpations: Abdomen is soft.     Tenderness: There is abdominal tenderness in the left upper quadrant and left lower quadrant. There is no right CVA tenderness, left CVA tenderness, guarding or rebound.     Comments: Benign abdominal exam; no evidence of acute abdomen on physical exam.  Mild tenderness palpation in left abdomen.  Psychiatric:        Behavior: Behavior is cooperative.      UC Treatments / Results  Labs (all labs ordered are listed, but only abnormal results are displayed) Labs Reviewed  POCT URINALYSIS DIPSTICK, ED / UC    EKG   Radiology No results found.  Procedures Procedures (including critical care time)  Medications Ordered in UC Medications - No data to display  Initial Impression / Assessment and Plan / UC Course  I have reviewed the triage vital signs and the nursing notes.  Pertinent labs & imaging results that were available during my care of the patient were reviewed by me and considered in my medical decision making (see chart for details).     Patient is well-appearing, afebrile, nontoxic, nontachycardic.  No indication for emergent evaluation or imaging based on exam today.  Urine was obtained and was negative for any evidence of infection.  Patient is status post hysterectomy so no concern for ectopic pregnancy.  She is having normal bowel movements so low suspicion for obstruction and therefore KUB was deferred.  We did discussed potential utility of STI testing but patient has no concern for this and so was  deferred today.  Will treat with Bentyl 10 mg twice daily to help manage symptoms.  Discussed that she should eat a bland diet and drink plenty of fluid.  If her symptoms or not improving she should follow-up with her primary care for additional testing including imaging since we are not able to arrange this in the urgent care.  Discussed that if she has any worsening or changing symptoms including severe abdominal pain, nausea, vomiting, melena, hematochezia she needs to go to the emergency room.  Will cover for otitis externa with ofloxacin drops.  Discussed that she should avoid prolonged water exposure.  If her symptoms or not improving or if anything worsens she is to return for reevaluation.  Final Clinical Impressions(s) / UC Diagnoses   Final diagnoses:  Acute otitis externa of right ear, unspecified type  Left sided abdominal pain     Discharge Instructions      Your urine was normal with no evidence of infection.  Your exam is reassuring.  I would eat a bland diet and drink plenty of fluid.  Follow-up with your primary care if your symptoms are improving quickly.  Take dicyclomine up to twice a day to help with abdominal symptoms.  If anything worsens and you have severe abdominal pain, blood in her stool, nausea/vomiting, you need to go to the emergency room.  I am concerned that you might have an external ear infection.  Please take ofloxacin drops in the right ear daily.  Try to avoid getting water in your ears is much as possible.  If you have any worsening or changing symptoms including additional drainage from the ear, fever, ear  pain you should be reevaluated.     ED Prescriptions     Medication Sig Dispense Auth. Provider   dicyclomine (BENTYL) 10 MG capsule Take 1 capsule (10 mg total) by mouth 2 (two) times daily as needed for spasms. 6 capsule Carlon Chaloux K, PA-C   ofloxacin (FLOXIN) 0.3 % OTIC solution Place 5 drops into the right ear daily. 5 mL Moriah Loughry K, PA-C       PDMP not reviewed this encounter.   Terrilee Croak, PA-C 09/12/22 1339

## 2022-09-12 NOTE — Discharge Instructions (Signed)
Your urine was normal with no evidence of infection.  Your exam is reassuring.  I would eat a bland diet and drink plenty of fluid.  Follow-up with your primary care if your symptoms are improving quickly.  Take dicyclomine up to twice a day to help with abdominal symptoms.  If anything worsens and you have severe abdominal pain, blood in her stool, nausea/vomiting, you need to go to the emergency room.  I am concerned that you might have an external ear infection.  Please take ofloxacin drops in the right ear daily.  Try to avoid getting water in your ears is much as possible.  If you have any worsening or changing symptoms including additional drainage from the ear, fever, ear pain you should be reevaluated.

## 2022-10-25 ENCOUNTER — Other Ambulatory Visit: Payer: Self-pay | Admitting: Otolaryngology

## 2022-10-25 DIAGNOSIS — H61303 Acquired stenosis of external ear canal, unspecified, bilateral: Secondary | ICD-10-CM

## 2022-11-09 ENCOUNTER — Other Ambulatory Visit: Payer: Medicaid Other

## 2022-11-23 ENCOUNTER — Ambulatory Visit
Admission: RE | Admit: 2022-11-23 | Discharge: 2022-11-23 | Disposition: A | Discharge status: Home / Self Care | Payer: Medicaid Other | Source: Ambulatory Visit | Attending: Otolaryngology | Admitting: Otolaryngology

## 2022-11-23 DIAGNOSIS — H61303 Acquired stenosis of external ear canal, unspecified, bilateral: Secondary | ICD-10-CM

## 2023-02-10 ENCOUNTER — Ambulatory Visit
Admission: EM | Admit: 2023-02-10 | Discharge: 2023-02-10 | Disposition: A | Discharge status: Home / Self Care | Payer: Medicaid Other | Attending: Nurse Practitioner | Admitting: Nurse Practitioner

## 2023-02-10 DIAGNOSIS — R319 Hematuria, unspecified: Secondary | ICD-10-CM | POA: Diagnosis present

## 2023-02-10 LAB — POCT URINALYSIS DIP (MANUAL ENTRY)
Bilirubin, UA: NEGATIVE
Glucose, UA: NEGATIVE mg/dL
Ketones, POC UA: NEGATIVE mg/dL
Leukocytes, UA: NEGATIVE
Nitrite, UA: NEGATIVE
Protein Ur, POC: 30 mg/dL — AB
Spec Grav, UA: 1.025 (ref 1.010–1.025)
Urobilinogen, UA: 0.2 E.U./dL
pH, UA: 6 (ref 5.0–8.0)

## 2023-02-10 MED ORDER — SULFAMETHOXAZOLE-TRIMETHOPRIM 800-160 MG PO TABS: 1.0000 | ORAL_TABLET | Freq: Two times a day (BID) | ORAL | 0 refills | Status: AC

## 2023-02-10 NOTE — ED Triage Notes (Signed)
Pt reports low abdominal pain, low back pain x 2 days. Pt reports she had groin pain and abdominal pain when she had a bowel movement.

## 2023-02-10 NOTE — Discharge Instructions (Signed)
Start Bactrim twice daily for 7 days.  The clinic will send her urine for culture and contact you with those results if positive.  Continue rest and fluids and over-the-counter Tylenol or ibuprofen as needed.  Please follow-up with your PCP if your symptoms do not improve.  Please go to the emergency room if you develop any worsening symptoms.  I hope you feel better soon!

## 2023-02-10 NOTE — ED Provider Notes (Addendum)
[facility]    CSN: [id number] Arrival date & time: 02/10/23  1138      History   Chief Complaint Chief Complaint  Patient presents with   Abdominal Pain    HPI Cindy Goodwin is a 37 y.o. female presents for evaluation of possible UTI.  Patient reports 2 days of some abdominal cramping with nausea and vomiting.  She reports yesterday she had pain with urination.  Denies urgency or frequency.  Reports nausea vomiting has resolved.  She continues to have some suprapubic pressure.  No fevers, chills, flank pain.  Does have a history of UTIs as well as pyelonephritis.  She is been taking Tylenol and ibuprofen for her symptoms.  Has a history of hysterectomy.  No other concerns at this time.   Abdominal Pain Associated symptoms: dysuria     Past Medical History:  Diagnosis Date   Diastrophic dysplasia    Kyphosis    Mild persistent asthma 12/31/2017   Palpitations    Scoliosis     Patient Active Problem List   Diagnosis Date Noted   Acute on chronic blood loss anemia 10/19/2021   Fibroids    Post-operative state 10/17/2021   Fibroids, intramural 10/17/2021   Anemia due to chronic blood loss 10/17/2021   Dwarfism 10/17/2021   Menorrhagia with regular cycle 10/17/2021   Cough, persistent 12/31/2017   Nonallergic rhinitis 12/31/2017   Mild persistent asthma/cough variant asthma 12/31/2017   Chronic rhinitis 12/31/2017    Past Surgical History:  Procedure Laterality Date   HYSTERECTOMY ABDOMINAL WITH SALPINGECTOMY Bilateral 10/17/2021   Procedure: HYSTERECTOMY ABDOMINAL WITH SALPINGECTOMY;  Surgeon: Willodean Rosenthal, MD;  Location: [facility] OR;  Service: Gynecology;  Laterality: Bilateral;   NO PAST SURGERIES      OB History     Gravida  0   Para  0   Term  0   Preterm  0   AB  0   Living  0      SAB  0   IAB  0   Ectopic  0   Multiple  0   Live Births  0            Home Medications    Prior to Admission medications    Medication Sig Start Date End Date Taking? Authorizing Provider  sulfamethoxazole-trimethoprim (BACTRIM DS) 800-160 MG tablet Take 1 tablet by mouth 2 (two) times daily for 7 days. 02/10/23 02/17/23 Yes Radford Pax, NP  albuterol (PROVENTIL HFA;VENTOLIN HFA) 108 (90 Base) MCG/ACT inhaler Inhale 1-2 puffs into the lungs every 6 (six) hours as needed for wheezing or shortness of breath. 12/31/17   Bobbitt, Heywood Iles, MD  aspirin EC 81 MG tablet Take 1 tablet (81 mg total) by mouth daily. Take after 12 weeks for prevention of preeclampsia later in pregnancy 10/18/21   Willodean Rosenthal, MD  dicyclomine (BENTYL) 10 MG capsule Take 1 capsule (10 mg total) by mouth 2 (two) times daily as needed for spasms. 09/12/22   Merrilee Jansky, MD  famotidine (PEPCID) 20 MG tablet Take 1 tablet (20 mg total) by mouth 2 (two) times daily. 04/04/22   Wallis Bamberg, PA-C  loratadine (CLARITIN) 10 MG tablet Take 10 mg by mouth daily as needed for allergies.    [provider]  ofloxacin (FLOXIN) 0.3 % OTIC solution Place 5 drops into the right ear daily. 09/12/22   LampteyBritta Mccreedy, MD    Family History Family History  Problem Relation Age of Onset  Allergic rhinitis Maternal Aunt    Allergic rhinitis Maternal Uncle    Cancer Maternal Grandmother    Diabetes Neg Hx    Hypertension Neg Hx     Social History Social History   Tobacco Use   Smoking status: Never   Smokeless tobacco: Never  Vaping Use   Vaping Use: Never used  Substance Use Topics   Alcohol use: Yes    Comment: occasional   Drug use: No     Allergies   Patient has no known allergies.   Review of Systems Review of Systems  Genitourinary:  Positive for dysuria.     Physical Exam Triage Vital Signs ED Triage Vitals  Enc Vitals Group     BP 02/10/23 1144 104/61     Pulse Rate 02/10/23 1144 94     Resp 02/10/23 1144 16     Temp 02/10/23 1144 98 F (36.7 C)     Temp Source 02/10/23 1144 Oral     SpO2 02/10/23  1144 96 %     Weight --      Height --      Head Circumference --      Peak Flow --      Pain Score 02/10/23 1149 3     Pain Loc --      Pain Edu? --      Excl. in [place]? --    No data found.  Updated Vital Signs BP 104/61 (BP Location: Right Arm)   Pulse 94   Temp 98 F (36.7 C) (Oral)   Resp 16   LMP 10/09/2021 (Exact Date)   SpO2 96%   Visual Acuity Right Eye Distance:   Left Eye Distance:   Bilateral Distance:    Right Eye Near:   Left Eye Near:    Bilateral Near:     Physical Exam Vitals and nursing note reviewed.  Constitutional:      Appearance: Normal appearance.  HENT:     Head: Normocephalic and atraumatic.  Eyes:     Pupils: Pupils are equal, round, and reactive to light.  Cardiovascular:     Rate and Rhythm: Normal rate.  Pulmonary:     Effort: Pulmonary effort is normal.  Abdominal:     Tenderness: There is no abdominal tenderness. There is no right CVA tenderness or left CVA tenderness.  Skin:    General: Skin is warm and dry.  Neurological:     General: No focal deficit present.     Mental Status: She is alert and oriented to person, place, and time.  Psychiatric:        Mood and Affect: Mood normal.        Behavior: Behavior normal.      UC Treatments / Results  Labs (all labs ordered are listed, but only abnormal results are displayed) Labs Reviewed  POCT URINALYSIS DIP (MANUAL ENTRY) - Abnormal; Notable for the following components:      Result Value   Clarity, UA cloudy (*)    Blood, UA trace-intact (*)    Protein Ur, POC =30 (*)    All other components within normal limits  URINE CULTURE   Comprehensive metabolic panel Order: [id number] Status: Final result     Visible to patient: Yes (not seen)     Next appt: None     Dx: Abdominal pain, epigastric; Abdominal...   1 Result Note      Component Ref Range & Units 10 mo ago 1 yr ago 10  yr ago  Glucose 70 - 99 mg/dL 78 77 CM NOT DONE CM  BUN 6 - 20 mg/dL 8 7 NOT DONE R,  CM  Creatinine, Ser 0.57 - 1.00 mg/dL 2.95 Low  <6.21 Low  R NOT DONE R, CM  eGFR >59 mL/min/1.73 144    BUN/Creatinine Ratio 9 - 23 30 High     Sodium 134 - 144 mmol/L 139 137 R 134 Low  R  Potassium 3.5 - 5.2 mmol/L 4.2 4.0 R 3.9 R  Chloride 96 - 106 mmol/L 102 104 R 102 R  CO2 20 - 29 mmol/L 20 24 R NOT DONE R, CM  Calcium 8.7 - 10.2 mg/dL 30.8 9.0 R NOT DONE R, CM  Total Protein 6.0 - 8.5 g/dL 7.6  NOT DONE R, CM  Albumin 3.9 - 4.9 g/dL 4.4  NOT DONE R, CM  Globulin, Total 1.5 - 4.5 g/dL 3.2    Albumin/Globulin Ratio 1.2 - 2.2 1.4    Bilirubin Total 0.0 - 1.2 mg/dL 0.3  NOT DONE R, CM  Alkaline Phosphatase 44 - 121 IU/L 42 Low   NOT DONE R, CM  AST 0 - 40 IU/L 20  NOT DONE R, CM  ALT 0 - 32 IU/L 6  NOT DONE R, CM  Resulting Agency [facility]         Narrative Performed by: Verdell Carmine Performed at:  [address] [facility] [address] [place], [place]  [id number] Lab Director: Jolene Schimke MD, Phone:  [phone]       EKG   Radiology No results found.  Procedures Procedures (including critical care time)  Medications Ordered in UC Medications - No data to display  Initial Impression / Assessment and Plan / UC Course  I have reviewed the triage vital signs and the nursing notes.  Pertinent labs & imaging results that were available during my care of the patient were reviewed by me and considered in my medical decision making (see chart for details).     Reviewed exam and symptoms with patient.  No red flags.  UA with trace blood otherwise unremarkable.  Will culture and will start treatment based on patient's symptoms.  Start Bactrim twice daily.  Advised rest, fluids, she may continue over-the-counter analgesics as needed.  Patient to follow-up with PCP if symptoms do not improve.  ER precautions reviewed and patient verbalized understanding Final Clinical Impressions(s) / UC Diagnoses   Final diagnoses:  Hematuria,  unspecified type     Discharge Instructions      Start Bactrim twice daily for 7 days.  The clinic will send her urine for culture and contact you with those results if positive.  Continue rest and fluids and over-the-counter Tylenol or ibuprofen as needed.  Please follow-up with your PCP if your symptoms do not improve.  Please go to the emergency room if you develop any worsening symptoms.  I hope you feel better soon!     ED Prescriptions     Medication Sig Dispense Auth. Provider   sulfamethoxazole-trimethoprim (BACTRIM DS) 800-160 MG tablet Take 1 tablet by mouth 2 (two) times daily for 7 days. 14 tablet Radford Pax, NP      PDMP not reviewed this encounter.   Radford Pax, NP 02/10/23 1222    Radford Pax, NP 02/10/23 [phone]

## 2023-02-12 ENCOUNTER — Encounter (HOSPITAL_COMMUNITY): Payer: Self-pay | Admitting: Emergency Medicine

## 2023-02-12 ENCOUNTER — Other Ambulatory Visit: Payer: Self-pay | Admitting: Internal Medicine

## 2023-02-12 LAB — URINE CULTURE

## 2023-02-13 LAB — LIPID PANEL
Cholesterol: 225 mg/dL — ABNORMAL HIGH (ref <200)
HDL: 72 mg/dL (ref >=50)
LDL Cholesterol (Calc): 136 mg/dL (calc) — ABNORMAL HIGH
Non-HDL Cholesterol (Calc): 153 mg/dL (calc) — ABNORMAL HIGH (ref <130)
Total CHOL/HDL Ratio: 3.1 (calc) (ref <5.0)
Triglycerides: 78 mg/dL (ref <150)

## 2023-02-13 LAB — TSH: TSH: 0.8 mIU/L

## 2023-02-13 LAB — COMPLETE METABOLIC PANEL WITH GFR
AG Ratio: 1.4 (calc) (ref 1.0–2.5)
ALT: 7 U/L (ref 6–29)
AST: 19 U/L (ref 10–30)
Albumin: 4.3 g/dL (ref 3.6–5.1)
Alkaline phosphatase (APISO): 39 U/L (ref 31–125)
BUN/Creatinine Ratio: 20 (calc) (ref 6–22)
BUN: 8 mg/dL (ref 7–25)
CO2: 19 mmol/L — ABNORMAL LOW (ref 20–32)
Calcium: 9.6 mg/dL (ref 8.6–10.2)
Chloride: 102 mmol/L (ref 98–110)
Creat: 0.41 mg/dL — ABNORMAL LOW (ref 0.50–0.97)
Globulin: 3.1 g/dL (calc) (ref 1.9–3.7)
Glucose, Bld: 78 mg/dL (ref 65–99)
Potassium: 4.4 mmol/L (ref 3.5–5.3)
Sodium: 136 mmol/L (ref 135–146)
Total Bilirubin: 0.3 mg/dL (ref 0.2–1.2)
Total Protein: 7.4 g/dL (ref 6.1–8.1)
eGFR: 130 mL/min/{1.73_m2} (ref >=60)

## 2023-02-13 LAB — CBC
HCT: 43.9 % (ref 35.0–45.0)
Hemoglobin: 14.3 g/dL (ref 11.7–15.5)
MCH: 29.5 pg (ref 27.0–33.0)
MCHC: 32.6 g/dL (ref 32.0–36.0)
MCV: 90.5 fL (ref 80.0–100.0)
MPV: 11.2 fL (ref 7.5–12.5)
Platelets: 299 10*3/uL (ref 140–400)
RBC: 4.85 10*6/uL (ref 3.80–5.10)
RDW: 13.9 % (ref 11.0–15.0)
WBC: 7 10*3/uL (ref 3.8–10.8)

## 2023-02-13 LAB — VITAMIN D 25 HYDROXY (VIT D DEFICIENCY, FRACTURES): Vit D, 25-Hydroxy: 14 ng/mL — ABNORMAL LOW (ref 30–100)

## 2023-10-03 NOTE — Therapy (Signed)
OUTPATIENT PHYSICAL THERAPY LOWER EXTREMITY EVALUATION   Patient Name: Cindy Goodwin MRN: [id number] DOB:[birth year], 38 y.o., female Today's Date: 10/04/2023  END OF SESSION:  PT End of Session - 10/04/23 1523     Visit Number 1    Number of Visits 9    Date for PT Re-Evaluation 12/06/23    Authorization Type MEDICAID OF [place]    PT Start Time 1315    PT Stop Time 1400    PT Time Calculation (min) 45 min    Activity Tolerance Patient tolerated treatment well    Behavior During Therapy WFL for tasks assessed/performed             Past Medical History:  Diagnosis Date   Diastrophic dysplasia    Kyphosis    Mild persistent asthma 12/31/2017   Palpitations    Scoliosis    Past Surgical History:  Procedure Laterality Date   HYSTERECTOMY ABDOMINAL WITH SALPINGECTOMY Bilateral 10/17/2021   Procedure: HYSTERECTOMY ABDOMINAL WITH SALPINGECTOMY;  Surgeon: Willodean Rosenthal, MD;  Location: [facility] OR;  Service: Gynecology;  Laterality: Bilateral;   NO PAST SURGERIES     Patient Active Problem List   Diagnosis Date Noted   Acute on chronic blood loss anemia 10/19/2021   Fibroids    Post-operative state 10/17/2021   Fibroids, intramural 10/17/2021   Anemia due to chronic blood loss 10/17/2021   Dwarfism 10/17/2021   Menorrhagia with regular cycle 10/17/2021   Cough, persistent 12/31/2017   Nonallergic rhinitis 12/31/2017   Mild persistent asthma/cough variant asthma 12/31/2017   Chronic rhinitis 12/31/2017    PCP: Fleet Contras, MD   REFERRING PROVIDER: Fleet Contras, MD   REFERRING DIAG: M25.562 left knee pain and M25.552 left hip pain  THERAPY DIAG:  Chronic pain of left knee  Chronic left hip pain  Abnormal posture  Rationale for Evaluation and Treatment: Rehabilitation  ONSET DATE: 8 months  SUBJECTIVE:   SUBJECTIVE STATEMENT: Pt reports experiencing L knee and hip pain when she moves out of the prolonged position of 1/2 kneeling on her L knee. Pt  states she adopts this position in her wheelchair because when sitting fully she feels like she is not able to breathe as well due to her scoliosis.   PERTINENT HISTORY: Diastrophic dysplasia, scoliosis, kyphosis  PAIN:  Are you having pain? Yes: NPRS scale: 8/10 Pain location: Primarily the hip Pain description: sharp Aggravating factors: Moving out of the prolonged sitting position Relieving factors: Resolves after unfolding  PRECAUTIONS: None  RED FLAGS: None   WEIGHT BEARING RESTRICTIONS: No  FALLS:  Has patient fallen in last 6 months? No  LIVING ENVIRONMENT: Lives with: lives with an adult companion Lives in: House/apartment  Level entry Has following equipment at home: Environmental consultant - 4 wheeled, Wheelchair (power), and shower chair  OCCUPATION: Artist  PLOF: Independent with household mobility with device and Independent with community mobility with device  PATIENT GOALS: To eliminate the pain  NEXT MD VISIT: Not scheduled  OBJECTIVE:  Note: Objective measures were completed at Evaluation unless otherwise noted.  DIAGNOSTIC FINDINGS: NA  PATIENT SURVEYS:  Patient Specific Functional Scale: Pain of the L knee and hip with moving L leg out from prolonged positioning of 1/2  kneeling on L knee= 8/10.  COGNITION: Overall cognitive status: Within functional limits for tasks assessed     SENSATION: WFL  EDEMA:  NA  MUSCLE LENGTH: Hamstrings: Right NT deg; Left NT deg [name] test: Right NT deg; Left NT deg  POSTURE: increased thoracic  kyphosis and scoliosis  PALPATION: TTP to the medial knee and lateral hip  LOWER EXTREMITY ROM:  Equal to the R and grossly WNLs Active ROM Right eval Left eval  Hip flexion    Hip extension    Hip abduction    Hip adduction    Hip internal rotation    Hip external rotation    Knee flexion    Knee extension    Ankle dorsiflexion    Ankle plantarflexion    Ankle inversion    Ankle eversion     (Blank rows = not  tested)  LOWER EXTREMITY MMT:  Equal to the R and grossly WNLs MMT Right eval Left eval  Hip flexion    Hip extension    Hip abduction    Hip adduction    Hip internal rotation    Hip external rotation    Knee flexion    Knee extension    Ankle dorsiflexion    Ankle plantarflexion    Ankle inversion    Ankle eversion     (Blank rows = not tested)  LOWER EXTREMITY SPECIAL TESTS:  Hip special tests: Luisa Hart (FABER) test: negative and Hip scouring test: negative Knee special tests: McMurray's test: negative  FUNCTIONAL TESTS:  NT  GAIT: Distance walked: 5 ft Assistive device utilized: Walker - 4 wheeled Level of assistance: Modified independence Comments: WNLs for condition                                                                                                                                TREATMENT DATE:  [facility] Adult PT Treatment:                                                DATE: 10/04/23 Self Care: Advised pt to move out of her normal positioning of half kneeling on L knee every 15 mins to either full sitting, 1/2 kneeling on R knee, or full standing to minimize prolonged L knee and hip strain from being is this position the majority of the time.    PATIENT EDUCATION:  Education details: Eval findings, POC, HEP Person educated: Patient and friend Education method: Medical illustrator Education comprehension: verbalized understanding  HOME EXERCISE PROGRAM: TBD  ASSESSMENT:  CLINICAL IMPRESSION: Patient is a 38 y.o. female who was seen today for physical therapy evaluation and treatment for M25.562 left knee pain and M25.552 left hip pain. Pt's L LE pain appears primarily related to adapting a prolonged position of 1/2 kneeling on her L knee which helps her trunk positioning to breathe better. With maintaining this position over a prolonged time frame, the pt experiences sharp L knee and hip pain when she straightens her L leg out moving from this  position. The pain will last approx 5-10 mins to resolve. Pt's  primary means of mobility is a power WC, with pt only occasionally using a 4 wheeled RW. Pt Ed (self care) was provided as above. Pt will benefit from skilled PT 1w8 to address impairments and establish a HEP (mobility and posture to assist quality of breathing) to optimize function and QOL with less pain.   .  OBJECTIVE IMPAIRMENTS: decreased activity tolerance, postural dysfunction, and pain.   ACTIVITY LIMITATIONS:  Quality of breathing  PARTICIPATION LIMITATIONS: occupation and ADLs  PERSONAL FACTORS: Diastrophic dysplasia, scoliosis, kyphosis are also affecting patient's functional outcome.   REHAB POTENTIAL: Good  CLINICAL DECISION MAKING: Stable/uncomplicated  EVALUATION COMPLEXITY: Low   GOALS:  SHORT TERM GOALS: Target date: 10/18/22 Pt will be Ind in an initial HEP  Baseline: TBD Goal status: INITIAL  2.  Pt will report 25% or greater improvement in her L knee and hip pain for improved quality of life Baseline:  Goal status: INITIAL  LONG TERM GOALS: Target date: 12/06/23  Pt will be Ind in a final HEP to maintain achieved LOF  Baseline: TBD Goal status: INITIAL  2.  Pain of the L knee and hip with moving L leg out from prolonged positioning of 1/2 kneeling on L knee will decrease by 75% or greater for improved QOL Baseline: 8/10 Goal status: INITIAL  3.  Pt will reports 80% or greater compliance of alternating her sitting positions every 15 mins to minimize the strain and pain on the L knee and hip Baseline:  Goal status: INITIAL   PLAN:  PT FREQUENCY: 1x/week  PT DURATION: 8 weeks  PLANNED INTERVENTIONS: 97164- PT Re-evaluation, 97110-Therapeutic exercises, 97530- Therapeutic activity, 97535- Self Care, 96045- Manual therapy, Patient/Family education, Joint mobilization, Cryotherapy, and Moist heat  PLAN FOR NEXT SESSION: Establish HEP; progress therex as indicated; use of modalities, manual  therapy; and TPDN as indicated.  Tanis Burnley MS, PT 10/04/23 3:34 PM

## 2023-10-04 ENCOUNTER — Ambulatory Visit: Payer: Medicaid Other | Attending: Internal Medicine

## 2023-10-04 ENCOUNTER — Other Ambulatory Visit: Payer: Self-pay

## 2023-10-04 DIAGNOSIS — M25562 Pain in left knee: Secondary | ICD-10-CM | POA: Diagnosis present

## 2023-10-04 DIAGNOSIS — R293 Abnormal posture: Secondary | ICD-10-CM | POA: Insufficient documentation

## 2023-10-04 DIAGNOSIS — G8929 Other chronic pain: Secondary | ICD-10-CM | POA: Diagnosis present

## 2023-10-04 DIAGNOSIS — M25552 Pain in left hip: Secondary | ICD-10-CM | POA: Insufficient documentation

## 2023-10-10 NOTE — Therapy (Incomplete)
OUTPATIENT PHYSICAL THERAPY LOWER EXTREMITY TREATMENT   Patient Name: Cindy Goodwin MRN: [id number] DOB:Nov 09, 1985, 38 y.o., female Today's Date: 10/10/2023  END OF SESSION:    Past Medical History:  Diagnosis Date   Diastrophic dysplasia    Kyphosis    Mild persistent asthma 12/31/2017   Palpitations    Scoliosis    Past Surgical History:  Procedure Laterality Date   HYSTERECTOMY ABDOMINAL WITH SALPINGECTOMY Bilateral 10/17/2021   Procedure: HYSTERECTOMY ABDOMINAL WITH SALPINGECTOMY;  Surgeon: Cindy Rosenthal, MD;  Location: [facility] OR;  Service: Gynecology;  Laterality: Bilateral;   NO PAST SURGERIES     Patient Active Problem List   Diagnosis Date Noted   Acute on chronic blood loss anemia 10/19/2021   Fibroids    Post-operative state 10/17/2021   Fibroids, intramural 10/17/2021   Anemia due to chronic blood loss 10/17/2021   Dwarfism 10/17/2021   Menorrhagia with regular cycle 10/17/2021   Cough, persistent 12/31/2017   Nonallergic rhinitis 12/31/2017   Mild persistent asthma/cough variant asthma 12/31/2017   Chronic rhinitis 12/31/2017    PCP: Cindy Contras, MD   REFERRING PROVIDER: Fleet Contras, MD   REFERRING DIAG: M25.562 left knee pain and M25.552 left hip pain  THERAPY DIAG:  No diagnosis found.  Rationale for Evaluation and Treatment: Rehabilitation  ONSET DATE: 8 months  SUBJECTIVE:   SUBJECTIVE STATEMENT:   EVAL: Pt reports experiencing L knee and hip pain when she moves out of the prolonged position of 1/2 kneeling on her L knee. Pt states she adopts this position in her wheelchair because when sitting fully she feels like she is not able to breathe as well due to her scoliosis.   PERTINENT HISTORY: Diastrophic dysplasia, scoliosis, kyphosis  PAIN:  Are you having pain? Yes: NPRS scale: 8/10 Pain location: Primarily the hip Pain description: sharp Aggravating factors: Moving out of the prolonged sitting position Relieving  factors: Resolves after unfolding  PRECAUTIONS: None  RED FLAGS: None   WEIGHT BEARING RESTRICTIONS: No  FALLS:  Has patient fallen in last 6 months? No  LIVING ENVIRONMENT: Lives with: lives with an adult companion Lives in: House/apartment  Level entry Has following equipment at home: Environmental consultant - 4 wheeled, Wheelchair (power), and shower chair  OCCUPATION: Artist  PLOF: Independent with household mobility with device and Independent with community mobility with device  PATIENT GOALS: To eliminate the pain  NEXT MD VISIT: Not scheduled  OBJECTIVE:  Note: Objective measures were completed at Evaluation unless otherwise noted.  DIAGNOSTIC FINDINGS: NA  PATIENT SURVEYS:  Patient Specific Functional Scale: Pain of the L knee and hip with moving L leg out from prolonged positioning of 1/2  kneeling on L knee= 8/10.  COGNITION: Overall cognitive status: Within functional limits for tasks assessed     SENSATION: WFL  EDEMA:  NA  MUSCLE LENGTH: Hamstrings: Right NT deg; Left NT deg Cindy Goodwin test: Right NT deg; Left NT deg  POSTURE: increased thoracic kyphosis and scoliosis  PALPATION: TTP to the medial knee and lateral hip  LOWER EXTREMITY ROM:  Equal to the R and grossly WNLs Active ROM Right eval Left eval  Hip flexion    Hip extension    Hip abduction    Hip adduction    Hip internal rotation    Hip external rotation    Knee flexion    Knee extension    Ankle dorsiflexion    Ankle plantarflexion    Ankle inversion    Ankle eversion     (Blank  rows = not tested)  LOWER EXTREMITY MMT:  Equal to the R and grossly WNLs MMT Right eval Left eval  Hip flexion    Hip extension    Hip abduction    Hip adduction    Hip internal rotation    Hip external rotation    Knee flexion    Knee extension    Ankle dorsiflexion    Ankle plantarflexion    Ankle inversion    Ankle eversion     (Blank rows = not tested)  LOWER EXTREMITY SPECIAL TESTS:  Hip  special tests: Luisa Hart (FABER) test: negative and Hip scouring test: negative Knee special tests: McMurray's test: negative  FUNCTIONAL TESTS:  NT  GAIT: Distance walked: 5 ft Assistive device utilized: Walker - 4 wheeled Level of assistance: Modified independence Comments: WNLs for condition                                                                                                                                TREATMENT DATE:  [facility] Adult PT Treatment:                                                DATE: 10/11/23 Therapeutic Exercise: *** Manual Therapy: *** Neuromuscular re-ed: *** Therapeutic Activity: *** Modalities: *** Self Care: [facility] Adult PT Treatment:                                                DATE: 10/04/23 Self Care: Advised pt to move out of her normal positioning of half kneeling on L knee every 15 mins to either full sitting, 1/2 kneeling on R knee, or full standing to minimize prolonged L knee and hip strain from being is this position the majority of the time.    PATIENT EDUCATION:  Education details: Eval findings, POC, HEP Person educated: Patient and friend Education method: Medical illustrator Education comprehension: verbalized understanding  HOME EXERCISE PROGRAM: TBD  ASSESSMENT:  CLINICAL IMPRESSION:   EVAL: Patient is a 38 y.o. female who was seen today for physical therapy evaluation and treatment for M25.562 left knee pain and M25.552 left hip pain. Pt's L LE pain appears primarily related to adapting a prolonged position of 1/2 kneeling on her L knee which helps her trunk positioning to breathe better. With maintaining this position over a prolonged time frame, the pt experiences sharp L knee and hip pain when she straightens her L leg out moving from this position. The pain will last approx 5-10 mins to resolve. Pt's primary means of mobility is a power WC, with pt only occasionally using a 4 wheeled RW. Pt Ed (self  care) was  provided as above. Pt will benefit from skilled PT 1w8 to address impairments and establish a HEP (mobility and posture to assist quality of breathing) to optimize function and QOL with less pain.   .  OBJECTIVE IMPAIRMENTS: decreased activity tolerance, postural dysfunction, and pain.   ACTIVITY LIMITATIONS:  Quality of breathing  PARTICIPATION LIMITATIONS: occupation and ADLs  PERSONAL FACTORS: Diastrophic dysplasia, scoliosis, kyphosis are also affecting patient's functional outcome.   REHAB POTENTIAL: Good  CLINICAL DECISION MAKING: Stable/uncomplicated  EVALUATION COMPLEXITY: Low   GOALS:  SHORT TERM GOALS: Target date: 10/18/22 Pt will be Ind in an initial HEP  Baseline: TBD Goal status: INITIAL  2.  Pt will report 25% or greater improvement in her L knee and hip pain for improved quality of life Baseline:  Goal status: INITIAL  LONG TERM GOALS: Target date: 12/06/23  Pt will be Ind in a final HEP to maintain achieved LOF  Baseline: TBD Goal status: INITIAL  2.  Pain of the L knee and hip with moving L leg out from prolonged positioning of 1/2 kneeling on L knee will decrease by 75% or greater for improved QOL Baseline: 8/10 Goal status: INITIAL  3.  Pt will reports 80% or greater compliance of alternating her sitting positions every 15 mins to minimize the strain and pain on the L knee and hip Baseline:  Goal status: INITIAL   PLAN:  PT FREQUENCY: 1x/week  PT DURATION: 8 weeks  PLANNED INTERVENTIONS: 97164- PT Re-evaluation, 97110-Therapeutic exercises, 97530- Therapeutic activity, 97535- Self Care, 16109- Manual therapy, Patient/Family education, Joint mobilization, Cryotherapy, and Moist heat  PLAN FOR NEXT SESSION: Establish HEP; progress therex as indicated; use of modalities, manual therapy; and TPDN as indicated.  Jaleyah Longhi MS, PT 10/10/23 9:02 PM

## 2023-10-11 ENCOUNTER — Ambulatory Visit: Payer: Medicaid Other

## 2023-10-14 NOTE — Therapy (Signed)
 OUTPATIENT PHYSICAL THERAPY LOWER EXTREMITY TREATMENT   Patient Name: Cindy Goodwin MRN: [id number] DOB:29-Dec-1985, 38 y.o., female Today's Date: 10/16/2023  END OF SESSION:  PT End of Session - 10/16/23 1702     Visit Number 2    Number of Visits 9    Date for PT Re-Evaluation 12/06/23    Authorization Type MEDICAID OF [place]    Authorization - Visit Number 2    Authorization - Number of Visits 3    PT Start Time 1635    PT Stop Time 1718    PT Time Calculation (min) 43 min    Activity Tolerance Patient tolerated treatment well    Behavior During Therapy WFL for tasks assessed/performed              Past Medical History:  Diagnosis Date   Diastrophic dysplasia    Kyphosis    Mild persistent asthma 12/31/2017   Palpitations    Scoliosis    Past Surgical History:  Procedure Laterality Date   HYSTERECTOMY ABDOMINAL WITH SALPINGECTOMY Bilateral 10/17/2021   Procedure: HYSTERECTOMY ABDOMINAL WITH SALPINGECTOMY;  Surgeon: Willodean Rosenthal, MD;  Location: [facility] OR;  Service: Gynecology;  Laterality: Bilateral;   NO PAST SURGERIES     Patient Active Problem List   Diagnosis Date Noted   Acute on chronic blood loss anemia 10/19/2021   Fibroids    Post-operative state 10/17/2021   Fibroids, intramural 10/17/2021   Anemia due to chronic blood loss 10/17/2021   Dwarfism 10/17/2021   Menorrhagia with regular cycle 10/17/2021   Cough, persistent 12/31/2017   Nonallergic rhinitis 12/31/2017   Mild persistent asthma/cough variant asthma 12/31/2017   Chronic rhinitis 12/31/2017    PCP: Fleet Contras, MD   REFERRING PROVIDER: Fleet Contras, MD   REFERRING DIAG: M25.562 left knee pain and M25.552 left hip pain  THERAPY DIAG:  Chronic pain of left knee  Chronic left hip pain  Abnormal posture  Rationale for Evaluation and Treatment: Rehabilitation  ONSET DATE: 8 months  SUBJECTIVE:   SUBJECTIVE STATEMENT: Pt reports her L hip is feeling better. She has  been shifting her standing position more consistently and has lost a couple of pounds as well. Pt estimates approx 15% improvement in pain. Pt notes she has not increased her time walking.  EVAL: Pt reports experiencing L knee and hip pain when she moves out of the prolonged position of 1/2 kneeling on her L knee. Pt states she adopts this position in her wheelchair because when sitting fully she feels like she is not able to breathe as well due to her scoliosis.   PERTINENT HISTORY: Diastrophic dysplasia, scoliosis, kyphosis  PAIN:  Are you having pain? Yes: NPRS scale: 6-8/10 Pain location: Primarily the hip Pain description: sharp, intermittent Aggravating factors: Moving out of the prolonged sitting position Relieving factors: Resolves after unfolding  PRECAUTIONS: None  RED FLAGS: None   WEIGHT BEARING RESTRICTIONS: No  FALLS:  Has patient fallen in last 6 months? No  LIVING ENVIRONMENT: Lives with: lives with an adult companion Lives in: House/apartment  Level entry Has following equipment at home: Environmental consultant - 4 wheeled, Wheelchair (power), and shower chair  OCCUPATION: Artist  PLOF: Independent with household mobility with device and Independent with community mobility with device  PATIENT GOALS: To eliminate the pain  NEXT MD VISIT: Not scheduled  OBJECTIVE:  Note: Objective measures were completed at Evaluation unless otherwise noted.  DIAGNOSTIC FINDINGS: NA  PATIENT SURVEYS:  Patient Specific Functional Scale: Pain of the  L knee and hip with moving L leg out from prolonged positioning of 1/2  kneeling on L knee= 8/10.  COGNITION: Overall cognitive status: Within functional limits for tasks assessed     SENSATION: WFL  EDEMA:  NA  MUSCLE LENGTH: Hamstrings: Right NT deg; Left NT deg [name] test: Right NT deg; Left NT deg  POSTURE: increased thoracic kyphosis and scoliosis  PALPATION: TTP to the medial knee and lateral hip  LOWER EXTREMITY  ROM:  Equal to the R and grossly WNLs Active ROM Right eval Left eval  Hip flexion    Hip extension    Hip abduction    Hip adduction    Hip internal rotation    Hip external rotation    Knee flexion    Knee extension    Ankle dorsiflexion    Ankle plantarflexion    Ankle inversion    Ankle eversion     (Blank rows = not tested)  LOWER EXTREMITY MMT:  Equal to the R and grossly WNLs MMT Right eval Left eval  Hip flexion    Hip extension    Hip abduction    Hip adduction    Hip internal rotation    Hip external rotation    Knee flexion    Knee extension    Ankle dorsiflexion    Ankle plantarflexion    Ankle inversion    Ankle eversion     (Blank rows = not tested)  LOWER EXTREMITY SPECIAL TESTS:  Hip special tests: Luisa Hart (FABER) test: negative and Hip scouring test: negative Knee special tests: McMurray's test: negative  FUNCTIONAL TESTS:  NT  GAIT: Distance walked: 5 ft Assistive device utilized: Walker - 4 wheeled Level of assistance: Modified independence Comments: WNLs for condition                                                                                                                              TREATMENT DATE:  [facility] Adult PT Treatment:                                                DATE: 10/11/23 Therapeutic Exercise: Supine hip/knee flex/ext 2x10 Bridging with square knee bolster 2x10 Supine hip add c square knee bolster 2x10 3" Supine hip alt abd 2x10 RTB Supine hip flexion 2x10 RTB Sit t/f stand with RW 2x10 Developed HEP to completed every other day. Pt encouraged to walk daily.  [facility] Adult PT Treatment:                                                DATE: 10/04/23 Self Care: Advised pt to move out of her normal positioning of half kneeling  on L knee every 15 mins to either full sitting, 1/2 kneeling on R knee, or full standing to minimize prolonged L knee and hip strain from being is this position the majority of the time.    PATIENT  EDUCATION:  Education details: Eval findings, POC, HEP Person educated: Patient and friend Education method: Medical illustrator Education comprehension: verbalized understanding  HOME EXERCISE PROGRAM: Access Code: [email or web address] URL: [email or web address] Date: 10/16/2023 Prepared by: Joellyn Rued  Exercises - Hip Flexion  - 1 x daily - 7 x weekly - 2 sets - 10 reps - Supine Bridge  - 1 x daily - 7 x weekly - 2 sets - 10 reps - 3 hold - Supine Hip Adduction Isometric with Ball  - 1 x daily - 7 x weekly - 2 sets - 10 reps - 3 hold - Supine Hip Abduction  - 1 x daily - 7 x weekly - 2 sets - 10 reps - 2 hold - Supine March with Resistance Band  - 1 x daily - 7 x weekly - 2 sets - 10 reps - 2 hold - Squat with Counter Support  - 1 x daily - 7 x weekly - 2 sets - 10 reps  ASSESSMENT:  CLINICAL IMPRESSION: Pt reports some improvement in her L hip pain by shifting out of her usual standing position approx 4x a day. PT was provided today for bilat hip strengthening and AROM therex and to establish a HEP. Pt tolerated Pt tolerated the prescribed therex today without adverse effects. Pt is to complete her HEP every other day and is to walk daily. Pt will continue to benefit from skilled PT to address impairments for improved L hip function with minimized pain.   EVAL: Patient is a 38 y.o. female who was seen today for physical therapy evaluation and treatment for M25.562 left knee pain and M25.552 left hip pain. Pt's L LE pain appears primarily related to adapting a prolonged position of 1/2 kneeling on her L knee which helps her trunk positioning to breathe better. With maintaining this position over a prolonged time frame, the pt experiences sharp L knee and hip pain when she straightens her L leg out moving from this position. The pain will last approx 5-10 mins to resolve. Pt's primary means of mobility is a power WC, with pt only occasionally using a 4 wheeled RW. Pt Ed (self  care) was provided as above. Pt will benefit from skilled PT 1w8 to address impairments and establish a HEP (mobility and posture to assist quality of breathing) to optimize function and QOL with less pain.   .  OBJECTIVE IMPAIRMENTS: decreased activity tolerance, postural dysfunction, and pain.   ACTIVITY LIMITATIONS:  Quality of breathing  PARTICIPATION LIMITATIONS: occupation and ADLs  PERSONAL FACTORS: Diastrophic dysplasia, scoliosis, kyphosis are also affecting patient's functional outcome.   REHAB POTENTIAL: Good  CLINICAL DECISION MAKING: Stable/uncomplicated  EVALUATION COMPLEXITY: Low   GOALS:  SHORT TERM GOALS: Target date: 10/18/22 Pt will be Ind in an initial HEP  Baseline: TBD Goal status: INITIAL  2.  Pt will report 25% or greater improvement in her L knee and hip pain for improved quality of life Baseline:  Goal status: INITIAL  LONG TERM GOALS: Target date: 12/06/23  Pt will be Ind in a final HEP to maintain achieved LOF  Baseline: TBD Goal status: INITIAL  2.  Pain of the L knee and hip with moving L leg out from prolonged positioning of 1/2 kneeling on  L knee will decrease by 75% or greater for improved QOL Baseline: 8/10 Goal status: INITIAL  3.  Pt will reports 80% or greater compliance of alternating her sitting positions every 15 mins to minimize the strain and pain on the L knee and hip Baseline:  Goal status: INITIAL   PLAN:  PT FREQUENCY: 1x/week  PT DURATION: 8 weeks  PLANNED INTERVENTIONS: 97164- PT Re-evaluation, 97110-Therapeutic exercises, 97530- Therapeutic activity, 97535- Self Care, 14782- Manual therapy, Patient/Family education, Joint mobilization, Cryotherapy, and Moist heat  PLAN FOR NEXT SESSION: Establish HEP; progress therex as indicated; use of modalities, manual therapy; and TPDN as indicated.  Rhyan Wolters MS, PT 10/16/23 5:45 PM

## 2023-10-16 ENCOUNTER — Ambulatory Visit: Payer: Medicaid Other | Attending: Internal Medicine

## 2023-10-16 DIAGNOSIS — R293 Abnormal posture: Secondary | ICD-10-CM | POA: Insufficient documentation

## 2023-10-16 DIAGNOSIS — M25562 Pain in left knee: Secondary | ICD-10-CM | POA: Insufficient documentation

## 2023-10-16 DIAGNOSIS — G8929 Other chronic pain: Secondary | ICD-10-CM | POA: Diagnosis present

## 2023-10-16 DIAGNOSIS — M25552 Pain in left hip: Secondary | ICD-10-CM | POA: Diagnosis present

## 2023-10-23 NOTE — Therapy (Signed)
 OUTPATIENT PHYSICAL THERAPY LOWER EXTREMITY TREATMENT/Re-Auth   Patient Name: Cindy Goodwin MRN: [id number] DOB:[birth year], 38 y.o., female Today's Date: 10/24/2023  END OF SESSION:  PT End of Session - 10/24/23 1653     Visit Number 3    Number of Visits 9    Date for PT Re-Evaluation 12/06/23    Authorization Type MEDICAID OF [place]    Authorization - Visit Number 3    Authorization - Number of Visits 3    PT Start Time 1640    PT Stop Time 1720    PT Time Calculation (min) 40 min    Activity Tolerance Patient tolerated treatment well               Past Medical History:  Diagnosis Date   Diastrophic dysplasia    Kyphosis    Mild persistent asthma 12/31/2017   Palpitations    Scoliosis    Past Surgical History:  Procedure Laterality Date   HYSTERECTOMY ABDOMINAL WITH SALPINGECTOMY Bilateral 10/17/2021   Procedure: HYSTERECTOMY ABDOMINAL WITH SALPINGECTOMY;  Surgeon: Willodean Rosenthal, MD;  Location: [facility] OR;  Service: Gynecology;  Laterality: Bilateral;   NO PAST SURGERIES     Patient Active Problem List   Diagnosis Date Noted   Acute on chronic blood loss anemia 10/19/2021   Fibroids    Post-operative state 10/17/2021   Fibroids, intramural 10/17/2021   Anemia due to chronic blood loss 10/17/2021   Dwarfism 10/17/2021   Menorrhagia with regular cycle 10/17/2021   Cough, persistent 12/31/2017   Nonallergic rhinitis 12/31/2017   Mild persistent asthma/cough variant asthma 12/31/2017   Chronic rhinitis 12/31/2017    PCP: Fleet Contras, MD   REFERRING PROVIDER: Fleet Contras, MD   REFERRING DIAG: M25.562 left knee pain and M25.552 left hip pain  THERAPY DIAG:  Chronic pain of left knee  Chronic left hip pain  Abnormal posture  Rationale for Evaluation and Treatment: Rehabilitation  ONSET DATE: 8 months  SUBJECTIVE:   SUBJECTIVE STATEMENT: Pt reports she more accustomed to switching weight bearing sides, waking more, and she likes her HEP.  She notes she is walking more, 1 to 2x a day for 5 to 10 mins.  EVAL: Pt reports experiencing L knee and hip pain when she moves out of the prolonged position of 1/2 kneeling on her L knee. Pt states she adopts this position in her wheelchair because when sitting fully she feels like she is not able to breathe as well due to her scoliosis.   PERTINENT HISTORY: Diastrophic dysplasia, scoliosis, kyphosis  PAIN: 10/24/23=2-3/10 with the high at 6/10 intermittently Are you having pain? Yes: NPRS scale: 6-8/10 Pain location: Primarily the L hip Pain description: sharp, intermittent Aggravating factors: Moving out of the prolonged sitting position Relieving factors: Resolves after unfolding  PRECAUTIONS: None  RED FLAGS: None   WEIGHT BEARING RESTRICTIONS: No  FALLS:  Has patient fallen in last 6 months? No  LIVING ENVIRONMENT: Lives with: lives with an adult companion Lives in: House/apartment  Level entry Has following equipment at home: Environmental consultant - 4 wheeled, Wheelchair (power), and shower chair  OCCUPATION: Artist  PLOF: Independent with household mobility with device and Independent with community mobility with device  PATIENT GOALS: To eliminate the pain  NEXT MD VISIT: Not scheduled  OBJECTIVE:  Note: Objective measures were completed at Evaluation unless otherwise noted.  DIAGNOSTIC FINDINGS: NA  PATIENT SURVEYS:  Patient Specific Functional Scale: Pain of the L knee and hip with moving L leg out from prolonged  positioning of 1/2  kneeling on L knee= 8/10.  COGNITION: Overall cognitive status: Within functional limits for tasks assessed     SENSATION: WFL  EDEMA:  NA  MUSCLE LENGTH: Hamstrings: Right NT deg; Left NT deg [name] test: Right NT deg; Left NT deg  POSTURE: increased thoracic kyphosis and scoliosis  PALPATION: TTP to the medial knee and lateral hip  LOWER EXTREMITY ROM:  Equal to the R and grossly WNLs Active ROM Right eval Left eval  Hip  flexion    Hip extension    Hip abduction    Hip adduction    Hip internal rotation    Hip external rotation    Knee flexion    Knee extension    Ankle dorsiflexion    Ankle plantarflexion    Ankle inversion    Ankle eversion     (Blank rows = not tested)  LOWER EXTREMITY MMT:  Equal to the R and grossly WNLs MMT Right eval Left eval  Hip flexion    Hip extension    Hip abduction    Hip adduction    Hip internal rotation    Hip external rotation    Knee flexion    Knee extension    Ankle dorsiflexion    Ankle plantarflexion    Ankle inversion    Ankle eversion     (Blank rows = not tested)  LOWER EXTREMITY SPECIAL TESTS:  Hip special tests: Luisa Hart (FABER) test: negative and Hip scouring test: negative Knee special tests: McMurray's test: negative  FUNCTIONAL TESTS:  NT  GAIT: Distance walked: 5 ft Assistive device utilized: Walker - 4 wheeled Level of assistance: Modified independence Comments: WNLs for condition                                                                                                                              TREATMENT DATE:  [facility] Adult PT Treatment:                                                DATE: 10/23/23 Therapeutic Exercise: Supine hip/knee flex/ext 2x10 Bridging with square knee bolster 2x10 Supine hip add c square knee bolster 2x10 3" Supine hip alt abd 2x10 RTB Supine hip flexion 2x10 RTB Supine chest press 2x10 8# Supine Lat pull downs 2x10 RTB Supine shoulder horz abd 2x10 RTB Sit t/f stand with RW 2x10 Updated HEP  [facility] Adult PT Treatment:                                                DATE: 10/11/23 Therapeutic Exercise: Supine hip/knee flex/ext 2x10 Bridging with square knee bolster 2x10 Supine hip add c square knee  bolster 2x10 3" Supine hip alt abd 2x10 RTB Supine hip flexion 2x10 RTB Sit t/f stand with RW 2x10 Developed HEP to completed every other day. Pt encouraged to walk daily.  [facility] Adult PT  Treatment:                                                DATE: 10/04/23 Self Care: Advised pt to move out of her normal positioning of half kneeling on L knee every 15 mins to either full sitting, 1/2 kneeling on R knee, or full standing to minimize prolonged L knee and hip strain from being is this position the majority of the time.    PATIENT EDUCATION:  Education details: Eval findings, POC, HEP Person educated: Patient and friend Education method: Medical illustrator Education comprehension: verbalized understanding  HOME EXERCISE PROGRAM: Access Code: [email or web address] URL: [email or web address] Date: 10/24/2023 Prepared by: Joellyn Rued  Exercises - Hip Flexion  - 1 x daily - 7 x weekly - 2 sets - 10 reps - Supine Bridge  - 1 x daily - 7 x weekly - 2 sets - 10 reps - 3 hold - Supine Hip Adduction Isometric with Ball  - 1 x daily - 7 x weekly - 2 sets - 10 reps - 3 hold - Supine Hip Abduction  - 1 x daily - 7 x weekly - 2 sets - 10 reps - 2 hold - Supine March with Resistance Band  - 1 x daily - 7 x weekly - 2 sets - 10 reps - 2 hold - Squat with Counter Support  - 1 x daily - 7 x weekly - 2 sets - 10 reps - Supine Shoulder Press AAROM in Abduction with Dowel  - 1 x daily - 7 x weekly - 2 sets - 10 reps - 2 hold - Supine Shoulder Horizontal Abduction with Resistance  - 1 x daily - 7 x weekly - 2 sets - 10 reps - 2 hold - Supine 90/90 Shoulder Extension with Resistance  - 1 x daily - 7 x weekly - 2 sets - 10 reps - 2 hold ASSESSMENT:  CLINICAL IMPRESSION: PT was completed for lower and upper body, and trunk strengthening. Today therex for strengthening the upper body and core was introduced. Pt tolerated the prescribed therex today without adverse effects. Pt reports she is consistently completing her HEP and walking more. With education to modify her sitting and standing positions periodically during the day, and with initiating a strengthening therex, pt reports she  is experiencing a decrease in the pain of her L hip. Pt is responding positively current course of PT. Pt will continue to benefit from skilled PT 1w5 to address impairments for improved L hip function with minimized pain.   EVAL: Patient is a 38 y.o. female who was seen today for physical therapy evaluation and treatment for M25.562 left knee pain and M25.552 left hip pain. Pt's L LE pain appears primarily related to adapting a prolonged position of 1/2 kneeling on her L knee which helps her trunk positioning to breathe better. With maintaining this position over a prolonged time frame, the pt experiences sharp L knee and hip pain when she straightens her L leg out moving from this position. The pain will last approx 5-10 mins to resolve. Pt's primary means of mobility is a power WC, with pt  only occasionally using a 4 wheeled RW. Pt Ed (self care) was provided as above. Pt will benefit from skilled PT 1w8 to address impairments and establish a HEP (mobility and posture to assist quality of breathing) to optimize function and QOL with less pain.   .  OBJECTIVE IMPAIRMENTS: decreased activity tolerance, postural dysfunction, and pain.   ACTIVITY LIMITATIONS:  Quality of breathing  PARTICIPATION LIMITATIONS: occupation and ADLs  PERSONAL FACTORS: Diastrophic dysplasia, scoliosis, kyphosis are also affecting patient's functional outcome.   REHAB POTENTIAL: Good  CLINICAL DECISION MAKING: Stable/uncomplicated  EVALUATION COMPLEXITY: Low   GOALS:  SHORT TERM GOALS: Target date: 10/18/22 Pt will be Ind in an initial HEP  Baseline: TBD Goal status: 10/24/23 MET  2.  Pt will report 25% or greater improvement in her L knee and hip pain for improved quality of life Baseline:  10/24/23: 30% Goal status: MET  LONG TERM GOALS: Target date: 12/06/23  Pt will be Ind in a final HEP to maintain achieved LOF  Baseline: TBD Goal status: ONGOING  2.  Pain of the L knee and hip with moving L leg out from  prolonged positioning of 1/2 kneeling on L knee will decrease by 75% or greater for improved QOL Baseline: 8/10 10/24/23: 2-3/10, with the high 6/10 intrmittently Goal status: IMPROVING  3.  Pt will reports 80% or greater compliance of alternating her sitting positions every 15 mins to minimize the strain and pain on the L knee and hip Baseline: Not shifting 10/24/23: Completing consistently Goal status: IMPROVING   PLAN:  PT FREQUENCY: 1x/week  PT DURATION: 8 weeks  PLANNED INTERVENTIONS: 97164- PT Re-evaluation, 97110-Therapeutic exercises, 97530- Therapeutic activity, 97535- Self Care, 16109- Manual therapy, Patient/Family education, Joint mobilization, Cryotherapy, and Moist heat  PLAN FOR NEXT SESSION: Establish HEP; progress therex as indicated; use of modalities, manual therapy; and TPDN as indicated.  Lelani Garnett MS, PT 10/24/23 9:33 PM

## 2023-10-24 ENCOUNTER — Ambulatory Visit: Payer: Medicaid Other

## 2023-10-24 DIAGNOSIS — G8929 Other chronic pain: Secondary | ICD-10-CM

## 2023-10-24 DIAGNOSIS — R293 Abnormal posture: Secondary | ICD-10-CM

## 2023-10-24 DIAGNOSIS — M25562 Pain in left knee: Secondary | ICD-10-CM | POA: Diagnosis not present

## 2023-10-29 NOTE — Therapy (Signed)
 OUTPATIENT PHYSICAL THERAPY LOWER EXTREMITY TREATMENT/Re-Auth   Patient Name: Cindy Goodwin MRN: [id number] DOB:[birth year], 38 y.o., female Today's Date: 10/30/2023  END OF SESSION:  PT End of Session - 10/30/23 1427     Visit Number 4    Number of Visits 9    Date for PT Re-Evaluation 12/06/23    Authorization Type MEDICAID OF [place]    Authorization - Visit Number 1    Authorization - Number of Visits 6    PT Start Time 1420    PT Stop Time 1500    PT Time Calculation (min) 40 min    Activity Tolerance Patient tolerated treatment well    Behavior During Therapy WFL for tasks assessed/performed                Past Medical History:  Diagnosis Date   Diastrophic dysplasia    Kyphosis    Mild persistent asthma 12/31/2017   Palpitations    Scoliosis    Past Surgical History:  Procedure Laterality Date   HYSTERECTOMY ABDOMINAL WITH SALPINGECTOMY Bilateral 10/17/2021   Procedure: HYSTERECTOMY ABDOMINAL WITH SALPINGECTOMY;  Surgeon: Willodean Rosenthal, MD;  Location: [facility] OR;  Service: Gynecology;  Laterality: Bilateral;   NO PAST SURGERIES     Patient Active Problem List   Diagnosis Date Noted   Acute on chronic blood loss anemia 10/19/2021   Fibroids    Post-operative state 10/17/2021   Fibroids, intramural 10/17/2021   Anemia due to chronic blood loss 10/17/2021   Dwarfism 10/17/2021   Menorrhagia with regular cycle 10/17/2021   Cough, persistent 12/31/2017   Nonallergic rhinitis 12/31/2017   Mild persistent asthma/cough variant asthma 12/31/2017   Chronic rhinitis 12/31/2017    PCP: Fleet Contras, MD   REFERRING PROVIDER: Fleet Contras, MD   REFERRING DIAG: M25.562 left knee pain and M25.552 left hip pain  THERAPY DIAG:  Chronic pain of left knee  Chronic left hip pain  Abnormal posture  Rationale for Evaluation and Treatment: Rehabilitation  ONSET DATE: 8 months  SUBJECTIVE:   SUBJECTIVE STATEMENT: Pt reports she feels she is getting  stronger. Walking is easier. Estimates 65% improvement in her L hip pain.  EVAL: Pt reports experiencing L knee and hip pain when she moves out of the prolonged position of 1/2 kneeling on her L knee. Pt states she adopts this position in her wheelchair because when sitting fully she feels like she is not able to breathe as well due to her scoliosis.   PERTINENT HISTORY: Diastrophic dysplasia, scoliosis, kyphosis  PAIN: 10/30/23=4/10 with the high at 6/10 intermittently Are you having pain? Yes: NPRS scale: 6-8/10 Pain location: Primarily the L hip Pain description: sharp, intermittent Aggravating factors: Moving out of the prolonged sitting position Relieving factors: Resolves after unfolding  PRECAUTIONS: None  RED FLAGS: None   WEIGHT BEARING RESTRICTIONS: No  FALLS:  Has patient fallen in last 6 months? No  LIVING ENVIRONMENT: Lives with: lives with an adult companion Lives in: House/apartment  Level entry Has following equipment at home: Environmental consultant - 4 wheeled, Wheelchair (power), and shower chair  OCCUPATION: Artist  PLOF: Independent with household mobility with device and Independent with community mobility with device  PATIENT GOALS: To eliminate the pain  NEXT MD VISIT: Not scheduled  OBJECTIVE:  Note: Objective measures were completed at Evaluation unless otherwise noted.  DIAGNOSTIC FINDINGS: NA  PATIENT SURVEYS:  Patient Specific Functional Scale: Pain of the L knee and hip with moving L leg out from prolonged positioning of 1/2  kneeling on L knee= 8/10.  COGNITION: Overall cognitive status: Within functional limits for tasks assessed     SENSATION: WFL  EDEMA:  NA  MUSCLE LENGTH: Hamstrings: Right NT deg; Left NT deg [name] test: Right NT deg; Left NT deg  POSTURE: increased thoracic kyphosis and scoliosis  PALPATION: TTP to the medial knee and lateral hip  LOWER EXTREMITY ROM:  Equal to the R and grossly WNLs Active ROM Right eval  Left eval  Hip flexion    Hip extension    Hip abduction    Hip adduction    Hip internal rotation    Hip external rotation    Knee flexion    Knee extension    Ankle dorsiflexion    Ankle plantarflexion    Ankle inversion    Ankle eversion     (Blank rows = not tested)  LOWER EXTREMITY MMT:  Equal to the R and grossly WNLs MMT Right eval Left eval  Hip flexion    Hip extension    Hip abduction    Hip adduction    Hip internal rotation    Hip external rotation    Knee flexion    Knee extension    Ankle dorsiflexion    Ankle plantarflexion    Ankle inversion    Ankle eversion     (Blank rows = not tested)  LOWER EXTREMITY SPECIAL TESTS:  Hip special tests: Luisa Hart (FABER) test: negative and Hip scouring test: negative Knee special tests: McMurray's test: negative  FUNCTIONAL TESTS:  NT  GAIT: Distance walked: 5 ft Assistive device utilized: Walker - 4 wheeled Level of assistance: Modified independence Comments: WNLs for condition                                                                                                                              TREATMENT DATE:  [facility] Adult PT Treatment:                                                DATE: 10/30/23 Therapeutic Exercise: Supine hip/knee flex/ext 2x10 Bridging with square knee bolster 2x10 Supine hip add c square knee bolster 2x10 3" Supine hip alt abd 2x10 RTB Supine hip flexion 2x10 RTB Supine chest press 2x10 8# Supine Lat pull downs 2x10 RTB Supine shoulder horz abd 2x10 RTB Sit t/f stand with RW x15  [facility] Adult PT Treatment:                                                DATE: 10/23/23 Therapeutic Exercise: Supine hip/knee flex/ext 2x10 Bridging with square knee bolster 2x10 Supine hip add c square knee bolster 2x10 3" Supine hip alt  abd 2x10 RTB Supine hip flexion 2x10 RTB Supine chest press 2x10 8# Supine Lat pull downs 2x10 RTB Supine shoulder horz abd 2x10 RTB Sit t/f stand with RW  2x10 Prone hip ext x10 each Prone arm and leg lifts x10 each  [facility] Adult PT Treatment:                                                DATE: 10/11/23 Therapeutic Exercise: Supine hip/knee flex/ext 2x10 Bridging with square knee bolster 2x10 Supine hip add c square knee bolster 2x10 3" Supine hip alt abd 2x10 RTB Supine hip flexion 2x10 RTB Sit t/f stand with RW 2x10 Developed HEP to completed every other day. Pt encouraged to walk daily.  [facility] Adult PT Treatment:                                                DATE: 10/04/23 Self Care: Advised pt to move out of her normal positioning of half kneeling on L knee every 15 mins to either full sitting, 1/2 kneeling on R knee, or full standing to minimize prolonged L knee and hip strain from being is this position the majority of the time.    PATIENT EDUCATION:  Education details: Eval findings, POC, HEP Person educated: Patient and friend Education method: Medical illustrator Education comprehension: verbalized understanding  HOME EXERCISE PROGRAM: Access Code: [email or web address] URL: [email or web address] Date: 10/24/2023 Prepared by: Joellyn Rued  Exercises - Hip Flexion  - 1 x daily - 7 x weekly - 2 sets - 10 reps - Supine Bridge  - 1 x daily - 7 x weekly - 2 sets - 10 reps - 3 hold - Supine Hip Adduction Isometric with Ball  - 1 x daily - 7 x weekly - 2 sets - 10 reps - 3 hold - Supine Hip Abduction  - 1 x daily - 7 x weekly - 2 sets - 10 reps - 2 hold - Supine March with Resistance Band  - 1 x daily - 7 x weekly - 2 sets - 10 reps - 2 hold - Squat with Counter Support  - 1 x daily - 7 x weekly - 2 sets - 10 reps - Supine Shoulder Press AAROM in Abduction with Dowel  - 1 x daily - 7 x weekly - 2 sets - 10 reps - 2 hold - Supine Shoulder Horizontal Abduction with Resistance  - 1 x daily - 7 x weekly - 2 sets - 10 reps - 2 hold - Supine 90/90 Shoulder Extension with Resistance  - 1 x daily - 7 x weekly - 2 sets - 10 reps  - 2 hold ASSESSMENT:  CLINICAL IMPRESSION: Continued lower and upper body, and trunk strengthening. Therex was added to address back strengthening with prone exs. Pt is responding well to PT with reported improved strength and function, and decreased L hip pain. Pt will continue to benefit from skilled PT to address impairments for improved function with minimized pain.  EVAL: Patient is a 38 y.o. female who was seen today for physical therapy evaluation and treatment for M25.562 left knee pain and M25.552 left hip pain. Pt's L LE pain appears primarily related to adapting a prolonged position of  1/2 kneeling on her L knee which helps her trunk positioning to breathe better. With maintaining this position over a prolonged time frame, the pt experiences sharp L knee and hip pain when she straightens her L leg out moving from this position. The pain will last approx 5-10 mins to resolve. Pt's primary means of mobility is a power WC, with pt only occasionally using a 4 wheeled RW. Pt Ed (self care) was provided as above. Pt will benefit from skilled PT 1w8 to address impairments and establish a HEP (mobility and posture to assist quality of breathing) to optimize function and QOL with less pain.   .  OBJECTIVE IMPAIRMENTS: decreased activity tolerance, postural dysfunction, and pain.   ACTIVITY LIMITATIONS:  Quality of breathing  PARTICIPATION LIMITATIONS: occupation and ADLs  PERSONAL FACTORS: Diastrophic dysplasia, scoliosis, kyphosis are also affecting patient's functional outcome.   REHAB POTENTIAL: Good  CLINICAL DECISION MAKING: Stable/uncomplicated  EVALUATION COMPLEXITY: Low   GOALS:  SHORT TERM GOALS: Target date: 10/18/22 Pt will be Ind in an initial HEP  Baseline: TBD Goal status: 10/24/23 MET  2.  Pt will report 25% or greater improvement in her L knee and hip pain for improved quality of life Baseline:  10/24/23: 30% Goal status: MET  LONG TERM GOALS: Target date:  12/06/23  Pt will be Ind in a final HEP to maintain achieved LOF  Baseline: TBD Goal status: ONGOING  2.  Pain of the L knee and hip with moving L leg out from prolonged positioning of 1/2 kneeling on L knee will decrease by 75% or greater for improved QOL Baseline: 8/10 10/24/23: 2-3/10, with the high 6/10 intrmittently Goal status: IMPROVING  3.  Pt will reports 80% or greater compliance of alternating her sitting positions every 15 mins to minimize the strain and pain on the L knee and hip Baseline: Not shifting 10/24/23: Completing consistently Goal status: IMPROVING   PLAN:  PT FREQUENCY: 1x/week  PT DURATION: 8 weeks  PLANNED INTERVENTIONS: 97164- PT Re-evaluation, 97110-Therapeutic exercises, 97530- Therapeutic activity, 97535- Self Care, 16109- Manual therapy, Patient/Family education, Joint mobilization, Cryotherapy, and Moist heat  PLAN FOR NEXT SESSION: Establish HEP; progress therex as indicated; use of modalities, manual therapy; and TPDN as indicated.  Adreanna Fickel MS, PT 10/30/23 3:03 PM

## 2023-10-30 ENCOUNTER — Ambulatory Visit: Payer: Medicaid Other

## 2023-10-30 DIAGNOSIS — M25562 Pain in left knee: Secondary | ICD-10-CM | POA: Diagnosis not present

## 2023-10-30 DIAGNOSIS — R293 Abnormal posture: Secondary | ICD-10-CM

## 2023-10-30 DIAGNOSIS — G8929 Other chronic pain: Secondary | ICD-10-CM

## 2023-11-06 ENCOUNTER — Ambulatory Visit

## 2023-11-06 DIAGNOSIS — R293 Abnormal posture: Secondary | ICD-10-CM

## 2023-11-06 DIAGNOSIS — G8929 Other chronic pain: Secondary | ICD-10-CM

## 2023-11-06 DIAGNOSIS — M25562 Pain in left knee: Secondary | ICD-10-CM | POA: Diagnosis not present

## 2023-11-06 NOTE — Therapy (Signed)
 OUTPATIENT PHYSICAL THERAPY LOWER EXTREMITY TREATMENT   Patient Name: Cindy Goodwin MRN: [id number] DOB:1985/10/03, 38 y.o., female Today's Date: 11/06/2023  END OF SESSION:  PT End of Session - 11/06/23 1522     Visit Number 5    Number of Visits 9    Date for PT Re-Evaluation 12/06/23    Authorization Type MEDICAID OF [place]    Authorization Time Period --    Authorization - Visit Number 2    Authorization - Number of Visits 6    PT Start Time 1509    PT Stop Time 1548    PT Time Calculation (min) 39 min    Activity Tolerance Patient tolerated treatment well    Behavior During Therapy WFL for tasks assessed/performed                 Past Medical History:  Diagnosis Date   Diastrophic dysplasia    Kyphosis    Mild persistent asthma 12/31/2017   Palpitations    Scoliosis    Past Surgical History:  Procedure Laterality Date   HYSTERECTOMY ABDOMINAL WITH SALPINGECTOMY Bilateral 10/17/2021   Procedure: HYSTERECTOMY ABDOMINAL WITH SALPINGECTOMY;  Surgeon: Willodean Rosenthal, MD;  Location: [facility] OR;  Service: Gynecology;  Laterality: Bilateral;   NO PAST SURGERIES     Patient Active Problem List   Diagnosis Date Noted   Acute on chronic blood loss anemia 10/19/2021   Fibroids    Post-operative state 10/17/2021   Fibroids, intramural 10/17/2021   Anemia due to chronic blood loss 10/17/2021   Dwarfism 10/17/2021   Menorrhagia with regular cycle 10/17/2021   Cough, persistent 12/31/2017   Nonallergic rhinitis 12/31/2017   Mild persistent asthma/cough variant asthma 12/31/2017   Chronic rhinitis 12/31/2017    PCP: Fleet Contras, MD   REFERRING PROVIDER: Fleet Contras, MD   REFERRING DIAG: M25.562 left knee pain and M25.552 left hip pain  THERAPY DIAG:  Chronic pain of left knee  Chronic left hip pain  Abnormal posture  Rationale for Evaluation and Treatment: Rehabilitation  ONSET DATE: 8 months  SUBJECTIVE:   SUBJECTIVE STATEMENT: Pt reports  her L hip is feeling better and overall she feels stronger and is moving better.  EVAL: Pt reports experiencing L knee and hip pain when she moves out of the prolonged position of 1/2 kneeling on her L knee. Pt states she adopts this position in her wheelchair because when sitting fully she feels like she is not able to breathe as well due to her scoliosis.   PERTINENT HISTORY: Diastrophic dysplasia, scoliosis, kyphosis  PAIN: 10/30/23=4/10 with the high at 6/10 intermittently Are you having pain? Yes: NPRS scale: 6-8/10 Pain location: Primarily the L hip Pain description: sharp, intermittent Aggravating factors: Moving out of the prolonged sitting position Relieving factors: Resolves after unfolding  PRECAUTIONS: None  RED FLAGS: None   WEIGHT BEARING RESTRICTIONS: No  FALLS:  Has patient fallen in last 6 months? No  LIVING ENVIRONMENT: Lives with: lives with an adult companion Lives in: House/apartment  Level entry Has following equipment at home: Environmental consultant - 4 wheeled, Wheelchair (power), and shower chair  OCCUPATION: Artist  PLOF: Independent with household mobility with device and Independent with community mobility with device  PATIENT GOALS: To eliminate the pain  NEXT MD VISIT: Not scheduled  OBJECTIVE:  Note: Objective measures were completed at Evaluation unless otherwise noted.  DIAGNOSTIC FINDINGS: NA  PATIENT SURVEYS:  Patient Specific Functional Scale: Pain of the L knee and hip with moving L leg  out from prolonged positioning of 1/2  kneeling on L knee= 8/10.  COGNITION: Overall cognitive status: Within functional limits for tasks assessed     SENSATION: WFL  EDEMA:  NA  MUSCLE LENGTH: Hamstrings: Right NT deg; Left NT deg [name] test: Right NT deg; Left NT deg  POSTURE: increased thoracic kyphosis and scoliosis  PALPATION: TTP to the medial knee and lateral hip  LOWER EXTREMITY ROM:  Equal to the R and grossly WNLs Active ROM Right eval  Left eval  Hip flexion    Hip extension    Hip abduction    Hip adduction    Hip internal rotation    Hip external rotation    Knee flexion    Knee extension    Ankle dorsiflexion    Ankle plantarflexion    Ankle inversion    Ankle eversion     (Blank rows = not tested)  LOWER EXTREMITY MMT:  Equal to the R and grossly WNLs MMT Right eval Left eval  Hip flexion    Hip extension    Hip abduction    Hip adduction    Hip internal rotation    Hip external rotation    Knee flexion    Knee extension    Ankle dorsiflexion    Ankle plantarflexion    Ankle inversion    Ankle eversion     (Blank rows = not tested)  LOWER EXTREMITY SPECIAL TESTS:  Hip special tests: Luisa Hart (FABER) test: negative and Hip scouring test: negative Knee special tests: McMurray's test: negative  FUNCTIONAL TESTS:  NT  GAIT: Distance walked: 5 ft Assistive device utilized: Walker - 4 wheeled Level of assistance: Modified independence Comments: WNLs for condition                                                                                                                              TREATMENT DATE:  [facility] Adult PT Treatment:                                                DATE: 11/06/23 Therapeutic Exercise: Bridging with square knee bolster 2x10 Supine hip abd 2x10 RTB Supine hip alt flexion 2x10 RTB Supine chest press 2x10 8# Supine Lat pull downs 2x10 RTB Supine shoulder horz abd 2x10 RTB Prone alt arm/leg lifts Prone planks c bolster x5 20' Reverse side planks c bolster at feet x5 10" each  [facility] Adult PT Treatment:                                                DATE: 10/30/23 Therapeutic Exercise: Supine hip/knee flex/ext 2x10 Bridging with square knee bolster 2x10 Supine hip add c  square knee bolster 2x10 3" Supine hip alt abd 2x10 RTB Supine hip flexion 2x10 RTB Supine chest press 2x10 8# Supine Lat pull downs 2x10 RTB Supine shoulder horz abd 2x10 RTB Sit t/f stand with RW  x15  [facility] Adult PT Treatment:                                                DATE: 10/23/23 Therapeutic Exercise: Supine hip/knee flex/ext 2x10 Bridging with square knee bolster 2x10 Supine hip add c square knee bolster 2x10 3" Supine hip alt abd 2x10 RTB Supine hip flexion 2x10 RTB Supine chest press 2x10 8# Supine Lat pull downs 2x10 RTB Supine shoulder horz abd 2x10 RTB Sit t/f stand with RW 2x10 Prone hip ext x10 each Prone arm and leg lifts x10 each  PATIENT EDUCATION:  Education details: Eval findings, POC, HEP Person educated: Patient and friend Education method: Medical illustrator Education comprehension: verbalized understanding  HOME EXERCISE PROGRAM: Access Code: [email or web address] URL: [email or web address] Date: 10/24/2023 Prepared by: Joellyn Rued  Exercises - Hip Flexion  - 1 x daily - 7 x weekly - 2 sets - 10 reps - Supine Bridge  - 1 x daily - 7 x weekly - 2 sets - 10 reps - 3 hold - Supine Hip Adduction Isometric with Ball  - 1 x daily - 7 x weekly - 2 sets - 10 reps - 3 hold - Supine Hip Abduction  - 1 x daily - 7 x weekly - 2 sets - 10 reps - 2 hold - Supine March with Resistance Band  - 1 x daily - 7 x weekly - 2 sets - 10 reps - 2 hold - Squat with Counter Support  - 1 x daily - 7 x weekly - 2 sets - 10 reps - Supine Shoulder Press AAROM in Abduction with Dowel  - 1 x daily - 7 x weekly - 2 sets - 10 reps - 2 hold - Supine Shoulder Horizontal Abduction with Resistance  - 1 x daily - 7 x weekly - 2 sets - 10 reps - 2 hold - Supine 90/90 Shoulder Extension with Resistance  - 1 x daily - 7 x weekly - 2 sets - 10 reps - 2 hold ASSESSMENT:  CLINICAL IMPRESSION: Continued lower and upper body, and trunk strengthening. Back and core strengthening exercises were added to today's program with pt tolerating without adverse effects. Pt reports the L hip is feeling better and overall she feels stronger and is moving better. Pt will continue to benefit  from skilled PT to address impairments for improved function with minimized pain.  EVAL: Patient is a 38 y.o. female who was seen today for physical therapy evaluation and treatment for M25.562 left knee pain and M25.552 left hip pain. Pt's L LE pain appears primarily related to adapting a prolonged position of 1/2 kneeling on her L knee which helps her trunk positioning to breathe better. With maintaining this position over a prolonged time frame, the pt experiences sharp L knee and hip pain when she straightens her L leg out moving from this position. The pain will last approx 5-10 mins to resolve. Pt's primary means of mobility is a power WC, with pt only occasionally using a 4 wheeled RW. Pt Ed (self care) was provided as above. Pt will benefit from skilled PT 1w8 to address  impairments and establish a HEP (mobility and posture to assist quality of breathing) to optimize function and QOL with less pain.   .  OBJECTIVE IMPAIRMENTS: decreased activity tolerance, postural dysfunction, and pain.   ACTIVITY LIMITATIONS:  Quality of breathing  PARTICIPATION LIMITATIONS: occupation and ADLs  PERSONAL FACTORS: Diastrophic dysplasia, scoliosis, kyphosis are also affecting patient's functional outcome.   REHAB POTENTIAL: Good  CLINICAL DECISION MAKING: Stable/uncomplicated  EVALUATION COMPLEXITY: Low   GOALS:  SHORT TERM GOALS: Target date: 10/18/22 Pt will be Ind in an initial HEP  Baseline: TBD Goal status: 10/24/23 MET  2.  Pt will report 25% or greater improvement in her L knee and hip pain for improved quality of life Baseline:  10/24/23: 30% Goal status: MET  LONG TERM GOALS: Target date: 12/06/23  Pt will be Ind in a final HEP to maintain achieved LOF  Baseline: TBD Goal status: ONGOING  2.  Pain of the L knee and hip with moving L leg out from prolonged positioning of 1/2 kneeling on L knee will decrease by 75% or greater for improved QOL Baseline: 8/10 10/24/23: 2-3/10, with the  high 6/10 intrmittently Goal status: IMPROVING  3.  Pt will reports 80% or greater compliance of alternating her sitting positions every 15 mins to minimize the strain and pain on the L knee and hip Baseline: Not shifting 10/24/23: Completing consistently Goal status: IMPROVING   PLAN:  PT FREQUENCY: 1x/week  PT DURATION: 8 weeks  PLANNED INTERVENTIONS: 97164- PT Re-evaluation, 97110-Therapeutic exercises, 97530- Therapeutic activity, 97535- Self Care, 16109- Manual therapy, Patient/Family education, Joint mobilization, Cryotherapy, and Moist heat  PLAN FOR NEXT SESSION: Establish HEP; progress therex as indicated; use of modalities, manual therapy; and TPDN as indicated.  Kamryn Gauthier MS, PT 11/06/23 6:23 PM

## 2023-11-07 ENCOUNTER — Ambulatory Visit: Payer: Medicaid Other

## 2023-11-12 NOTE — Therapy (Signed)
 OUTPATIENT PHYSICAL THERAPY LOWER EXTREMITY TREATMENT   Patient Name: Cindy Goodwin MRN: [id number] DOB:[birth year], 38 y.o., female Today's Date: 11/13/2023  END OF SESSION:  PT End of Session - 11/13/23 1504     Visit Number 6    Number of Visits 9    Date for PT Re-Evaluation 12/06/23    Authorization Type MEDICAID OF [place]    Authorization - Visit Number 3    Authorization - Number of Visits 6    PT Start Time 1503    PT Stop Time 1544    PT Time Calculation (min) 41 min    Activity Tolerance Patient tolerated treatment well    Behavior During Therapy WFL for tasks assessed/performed                  Past Medical History:  Diagnosis Date   Diastrophic dysplasia    Kyphosis    Mild persistent asthma 12/31/2017   Palpitations    Scoliosis    Past Surgical History:  Procedure Laterality Date   HYSTERECTOMY ABDOMINAL WITH SALPINGECTOMY Bilateral 10/17/2021   Procedure: HYSTERECTOMY ABDOMINAL WITH SALPINGECTOMY;  Surgeon: Willodean Rosenthal, MD;  Location: [facility] OR;  Service: Gynecology;  Laterality: Bilateral;   NO PAST SURGERIES     Patient Active Problem List   Diagnosis Date Noted   Acute on chronic blood loss anemia 10/19/2021   Fibroids    Post-operative state 10/17/2021   Fibroids, intramural 10/17/2021   Anemia due to chronic blood loss 10/17/2021   Dwarfism 10/17/2021   Menorrhagia with regular cycle 10/17/2021   Cough, persistent 12/31/2017   Nonallergic rhinitis 12/31/2017   Mild persistent asthma/cough variant asthma 12/31/2017   Chronic rhinitis 12/31/2017    PCP: Fleet Contras, MD   REFERRING PROVIDER: Fleet Contras, MD   REFERRING DIAG: M25.562 left knee pain and M25.552 left hip pain  THERAPY DIAG:  Chronic pain of left knee  Chronic left hip pain  Abnormal posture  Rationale for Evaluation and Treatment: Rehabilitation  ONSET DATE: 8 months  SUBJECTIVE:   SUBJECTIVE STATEMENT: Pt reports her L hip pain is 70% better. Pt  also can tell overall her body is getting stronger and her mobility is improving.  EVAL: Pt reports experiencing L knee and hip pain when she moves out of the prolonged position of 1/2 kneeling on her L knee. Pt states she adopts this position in her wheelchair because when sitting fully she feels like she is not able to breathe as well due to her scoliosis.   PERTINENT HISTORY: Diastrophic dysplasia, scoliosis, kyphosis  PAIN: 11/13/23=3/10 with the high at 6/10 intermittently Are you having pain? Yes: NPRS scale: 6-8/10 Pain location: Primarily the L hip Pain description: sharp, intermittent Aggravating factors: Moving out of the prolonged sitting position Relieving factors: Resolves after unfolding  PRECAUTIONS: None  RED FLAGS: None   WEIGHT BEARING RESTRICTIONS: No  FALLS:  Has patient fallen in last 6 months? No  LIVING ENVIRONMENT: Lives with: lives with an adult companion Lives in: House/apartment  Level entry Has following equipment at home: Environmental consultant - 4 wheeled, Wheelchair (power), and shower chair  OCCUPATION: Artist  PLOF: Independent with household mobility with device and Independent with community mobility with device  PATIENT GOALS: To eliminate the pain  NEXT MD VISIT: Not scheduled  OBJECTIVE:  Note: Objective measures were completed at Evaluation unless otherwise noted.  DIAGNOSTIC FINDINGS: NA  PATIENT SURVEYS:  Patient Specific Functional Scale: Pain of the L knee and hip with moving L  leg out from prolonged positioning of 1/2  kneeling on L knee= 8/10.  COGNITION: Overall cognitive status: Within functional limits for tasks assessed     SENSATION: WFL  EDEMA:  NA  MUSCLE LENGTH: Hamstrings: Right NT deg; Left NT deg [name] test: Right NT deg; Left NT deg  POSTURE: increased thoracic kyphosis and scoliosis  PALPATION: TTP to the medial knee and lateral hip  LOWER EXTREMITY ROM:  Equal to the R and grossly WNLs Active ROM Right eval  Left eval  Hip flexion    Hip extension    Hip abduction    Hip adduction    Hip internal rotation    Hip external rotation    Knee flexion    Knee extension    Ankle dorsiflexion    Ankle plantarflexion    Ankle inversion    Ankle eversion     (Blank rows = not tested)  LOWER EXTREMITY MMT:  Equal to the R and grossly WNLs MMT Right eval Left eval  Hip flexion    Hip extension    Hip abduction    Hip adduction    Hip internal rotation    Hip external rotation    Knee flexion    Knee extension    Ankle dorsiflexion    Ankle plantarflexion    Ankle inversion    Ankle eversion     (Blank rows = not tested)  LOWER EXTREMITY SPECIAL TESTS:  Hip special tests: Luisa Hart (FABER) test: negative and Hip scouring test: negative Knee special tests: McMurray's test: negative  FUNCTIONAL TESTS:  NT  GAIT: Distance walked: 5 ft Assistive device utilized: Walker - 4 wheeled Level of assistance: Modified independence Comments: WNLs for condition                                                                                                                              TREATMENT DATE:  [facility] Adult PT Treatment:                                                DATE: 11/13/23 Therapeutic Exercise: Bridging with square knee bolster 2x10 Supine hip abd 2x10 RTB Supine hip alt flexion 2x10 RTB Supine chest press 2x10 8# Supine Lat pull downs 2x10 RTB Supine shoulder horz abd 2x10 RTB Dead bugs 2x10 Prone alt arm/leg lifts Prone planks c bolster x5 10' Reverse side planks c bolster at feet x5 10" each Sit t/f stand with RW 2x10 Updated  [facility] Adult PT Treatment:                                                DATE: 11/06/23 Therapeutic Exercise: Bridging with square  knee bolster 2x10 Supine hip abd 2x10 RTB Supine hip alt flexion 2x10 RTB Supine chest press 2x10 8# Supine Lat pull downs 2x10 RTB Supine shoulder horz abd 2x10 RTB Prone alt arm/leg lifts Prone planks c bolster  x5 20' Reverse side planks c bolster at feet x5 10" each  PATIENT EDUCATION:  Education details: Eval findings, POC, HEP Person educated: Patient and friend Education method: Medical illustrator Education comprehension: verbalized understanding  HOME EXERCISE PROGRAM: Access Code: [email or web address] URL: [email or web address] Date: 11/13/2023 Prepared by: Joellyn Rued  Exercises - Hip Flexion  - 1 x daily - 7 x weekly - 2 sets - 10 reps - Supine Bridge  - 1 x daily - 7 x weekly - 2 sets - 10 reps - 3 hold - Supine Hip Adduction Isometric with Ball  - 1 x daily - 7 x weekly - 2 sets - 10 reps - 3 hold - Supine Hip Abduction  - 1 x daily - 7 x weekly - 2 sets - 10 reps - 2 hold - Supine March with Resistance Band  - 1 x daily - 7 x weekly - 2 sets - 10 reps - 2 hold - Squat with Counter Support  - 1 x daily - 7 x weekly - 2 sets - 10 reps - Supine Shoulder Press AAROM in Abduction with Dowel  - 1 x daily - 7 x weekly - 2 sets - 10 reps - 2 hold - Supine Shoulder Horizontal Abduction with Resistance  - 1 x daily - 7 x weekly - 2 sets - 10 reps - 2 hold - Supine 90/90 Shoulder Extension with Resistance  - 1 x daily - 7 x weekly - 2 sets - 10 reps - 2 hold - Dead Bug  - 1 x daily - 7 x weekly - 2 sets - 10 reps - 2 hold - Prone Alternating Arm and Leg Lifts  - 1 x daily - 7 x weekly - 2 sets - 10 reps - 2 hold - Standard Plank  - 1 x daily - 7 x weekly - 1 sets - 5 reps - 10 hold - Sidelying Short Adductor Forearm Plank  - 1 x daily - 7 x weekly - 1 sets - 5 reps - 10 hold ASSESSMENT:  CLINICAL IMPRESSION: Continued lower and upper body, and trunk strengthening. New back and core strengthening exercises were added to a good effect. Pt continues to report further improvement with her L hip pain. Pt's HEP was updated. Pt will continue to benefit from skilled PT to address impairments for improved function with minimized pain.  EVAL: Patient is a 38 y.o. female who was seen  today for physical therapy evaluation and treatment for M25.562 left knee pain and M25.552 left hip pain. Pt's L LE pain appears primarily related to adapting a prolonged position of 1/2 kneeling on her L knee which helps her trunk positioning to breathe better. With maintaining this position over a prolonged time frame, the pt experiences sharp L knee and hip pain when she straightens her L leg out moving from this position. The pain will last approx 5-10 mins to resolve. Pt's primary means of mobility is a power WC, with pt only occasionally using a 4 wheeled RW. Pt Ed (self care) was provided as above. Pt will benefit from skilled PT 1w8 to address impairments and establish a HEP (mobility and posture to assist quality of breathing) to optimize function and QOL with less pain.   Marland Kitchen  OBJECTIVE IMPAIRMENTS: decreased activity tolerance, postural dysfunction, and pain.   ACTIVITY LIMITATIONS:  Quality of breathing  PARTICIPATION LIMITATIONS: occupation and ADLs  PERSONAL FACTORS: Diastrophic dysplasia, scoliosis, kyphosis are also affecting patient's functional outcome.   REHAB POTENTIAL: Good  CLINICAL DECISION MAKING: Stable/uncomplicated  EVALUATION COMPLEXITY: Low   GOALS:  SHORT TERM GOALS: Target date: 10/18/22 Pt will be Ind in an initial HEP  Baseline: TBD Goal status: 10/24/23 MET  2.  Pt will report 25% or greater improvement in her L knee and hip pain for improved quality of life Baseline:  10/24/23: 30% 42/25: 70% improvement Goal status: MET  LONG TERM GOALS: Target date: 12/06/23  Pt will be Ind in a final HEP to maintain achieved LOF  Baseline: TBD Goal status: ONGOING  2.  Pain of the L knee and hip with moving L leg out from prolonged positioning of 1/2 kneeling on L knee will decrease by 75% or greater for improved QOL Baseline: 8/10 10/24/23: 2-3/10, with the high 6/10 intrmittently 11/13/23: 70% improvement Goal status: IMPROVING  3.  Pt will reports 80% or  greater compliance of alternating her sitting positions every 15 mins to minimize the strain and pain on the L knee and hip Baseline: Not shifting 10/24/23: Completing consistently Goal status: IMPROVING   PLAN:  PT FREQUENCY: 1x/week  PT DURATION: 8 weeks  PLANNED INTERVENTIONS: 97164- PT Re-evaluation, 97110-Therapeutic exercises, 97530- Therapeutic activity, 97535- Self Care, 16109- Manual therapy, Patient/Family education, Joint mobilization, Cryotherapy, and Moist heat  PLAN FOR NEXT SESSION: Establish HEP; progress therex as indicated; use of modalities, manual therapy; and TPDN as indicated.  Jeremian Whitby MS, PT 11/13/23 5:53 PM

## 2023-11-13 ENCOUNTER — Ambulatory Visit: Attending: Internal Medicine

## 2023-11-13 ENCOUNTER — Ambulatory Visit: Payer: Medicaid Other

## 2023-11-13 DIAGNOSIS — R293 Abnormal posture: Secondary | ICD-10-CM | POA: Diagnosis present

## 2023-11-13 DIAGNOSIS — G8929 Other chronic pain: Secondary | ICD-10-CM | POA: Insufficient documentation

## 2023-11-13 DIAGNOSIS — M25562 Pain in left knee: Secondary | ICD-10-CM | POA: Diagnosis present

## 2023-11-13 DIAGNOSIS — M25552 Pain in left hip: Secondary | ICD-10-CM | POA: Diagnosis present

## 2023-11-20 ENCOUNTER — Ambulatory Visit: Payer: Medicaid Other

## 2023-11-26 NOTE — Therapy (Signed)
 OUTPATIENT PHYSICAL THERAPY LOWER EXTREMITY TREATMENT   Patient Name: Cindy Goodwin MRN: [id number] DOB:[birth year], 38 y.o., female Today's Date: 11/28/2023  END OF SESSION:  PT End of Session - 11/27/23 1640     Visit Number 7    Number of Visits 9    Date for PT Re-Evaluation 12/06/23    Authorization Type MEDICAID OF [place]    Authorization - Visit Number 4    Authorization - Number of Visits 6    PT Start Time 1639    PT Stop Time 1724    PT Time Calculation (min) 45 min    Activity Tolerance Patient tolerated treatment well    Behavior During Therapy WFL for tasks assessed/performed                   Past Medical History:  Diagnosis Date   Diastrophic dysplasia    Kyphosis    Mild persistent asthma 12/31/2017   Palpitations    Scoliosis    Past Surgical History:  Procedure Laterality Date   HYSTERECTOMY ABDOMINAL WITH SALPINGECTOMY Bilateral 10/17/2021   Procedure: HYSTERECTOMY ABDOMINAL WITH SALPINGECTOMY;  Surgeon: Willodean Rosenthal, MD;  Location: [facility] OR;  Service: Gynecology;  Laterality: Bilateral;   NO PAST SURGERIES     Patient Active Problem List   Diagnosis Date Noted   Acute on chronic blood loss anemia 10/19/2021   Fibroids    Post-operative state 10/17/2021   Fibroids, intramural 10/17/2021   Anemia due to chronic blood loss 10/17/2021   Dwarfism 10/17/2021   Menorrhagia with regular cycle 10/17/2021   Cough, persistent 12/31/2017   Nonallergic rhinitis 12/31/2017   Mild persistent asthma/cough variant asthma 12/31/2017   Chronic rhinitis 12/31/2017    PCP: Fleet Contras, MD   REFERRING PROVIDER: Fleet Contras, MD   REFERRING DIAG: M25.562 left knee pain and M25.552 left hip pain  THERAPY DIAG:  Chronic pain of left knee  Chronic left hip pain  Abnormal posture  Rationale for Evaluation and Treatment: Rehabilitation  ONSET DATE: 8 months  SUBJECTIVE:   SUBJECTIVE STATEMENT: Pt continues to report her L hip is  feeling much better and her body feels stronger.  EVAL: Pt reports experiencing L knee and hip pain when she moves out of the prolonged position of 1/2 kneeling on her L knee. Pt states she adopts this position in her wheelchair because when sitting fully she feels like she is not able to breathe as well due to her scoliosis.   PERTINENT HISTORY: Diastrophic dysplasia, scoliosis, kyphosis  PAIN: 11/27/23=0/10 with the high at 5/10 intermittently Are you having pain? Yes: NPRS scale: 6-8/10 Pain location: Primarily the L hip Pain description: sharp, intermittent Aggravating factors: Moving out of the prolonged sitting position Relieving factors: Resolves after unfolding  PRECAUTIONS: None  RED FLAGS: None   WEIGHT BEARING RESTRICTIONS: No  FALLS:  Has patient fallen in last 6 months? No  LIVING ENVIRONMENT: Lives with: lives with an adult companion Lives in: House/apartment  Level entry Has following equipment at home: Environmental consultant - 4 wheeled, Wheelchair (power), and shower chair  OCCUPATION: Artist  PLOF: Independent with household mobility with device and Independent with community mobility with device  PATIENT GOALS: To eliminate the pain  NEXT MD VISIT: Not scheduled  OBJECTIVE:  Note: Objective measures were completed at Evaluation unless otherwise noted.  DIAGNOSTIC FINDINGS: NA  PATIENT SURVEYS:  Patient Specific Functional Scale: Pain of the L knee and hip with moving L leg out from prolonged positioning of 1/2  kneeling on L knee= 8/10.  COGNITION: Overall cognitive status: Within functional limits for tasks assessed     SENSATION: WFL  EDEMA:  NA  MUSCLE LENGTH: Hamstrings: Right NT deg; Left NT deg Andy Bannister test: Right NT deg; Left NT deg  POSTURE: increased thoracic kyphosis and scoliosis  PALPATION: TTP to the medial knee and lateral hip  LOWER EXTREMITY ROM:  Equal to the R and grossly WNLs Active ROM Right eval Left eval  Hip flexion    Hip  extension    Hip abduction    Hip adduction    Hip internal rotation    Hip external rotation    Knee flexion    Knee extension    Ankle dorsiflexion    Ankle plantarflexion    Ankle inversion    Ankle eversion     (Blank rows = not tested)  LOWER EXTREMITY MMT:  Equal to the R and grossly WNLs MMT Right eval Left eval  Hip flexion    Hip extension    Hip abduction    Hip adduction    Hip internal rotation    Hip external rotation    Knee flexion    Knee extension    Ankle dorsiflexion    Ankle plantarflexion    Ankle inversion    Ankle eversion     (Blank rows = not tested)  LOWER EXTREMITY SPECIAL TESTS:  Hip special tests: Portia Brittle (FABER) test: negative and Hip scouring test: negative Knee special tests: McMurray's test: negative  FUNCTIONAL TESTS:  NT  GAIT: Distance walked: 5 ft Assistive device utilized: Walker - 4 wheeled Level of assistance: Modified independence Comments: WNLs for condition                                                                                                                              TREATMENT DATE:  [facility] Adult PT Treatment:                                                DATE: 11/27/23 Therapeutic Exercise: Supine SLR stretch per PT assist and per pt c strap Supine long sitting stretch for both legs Supine hip adductor stretches per PT and caregiver assist Supine lying on rolled towel spinal lengthening Prone hip flexor stretches per PT and caregiver assist Self Care: Education to caregiver with verbal and tactile cueing to provide stretching to pt as noted above  [facility] Adult PT Treatment:                                                DATE: 11/13/23 Therapeutic Exercise: Bridging with square knee bolster 2x10 Supine hip abd 2x10 RTB Supine hip alt  flexion 2x10 RTB Supine chest press 2x10 8# Supine Lat pull downs 2x10 RTB Supine shoulder horz abd 2x10 RTB Dead bugs 2x10 Prone alt arm/leg lifts Prone planks c bolster  x5 10' Reverse side planks c bolster at feet x5 10" each Sit t/f stand with RW 2x10 Updated  [facility] Adult PT Treatment:                                                DATE: 11/06/23 Therapeutic Exercise: Bridging with square knee bolster 2x10 Supine hip abd 2x10 RTB Supine hip alt flexion 2x10 RTB Supine chest press 2x10 8# Supine Lat pull downs 2x10 RTB Supine shoulder horz abd 2x10 RTB Prone alt arm/leg lifts Prone planks c bolster x5 20' Reverse side planks c bolster at feet x5 10" each  PATIENT EDUCATION:  Education details: Eval findings, POC, HEP Person educated: Patient and friend Education method: Explanation and Demonstration Education comprehension: verbalized understanding  HOME EXERCISE PROGRAM: Access Code: [email or web address] URL: [email or web address] Date: 11/27/2023 Prepared by: Joellyn Rued  Exercises - Hip Flexion  - 1 x daily - 7 x weekly - 2 sets - 10 reps - Supine Bridge  - 1 x daily - 7 x weekly - 2 sets - 10 reps - 3 hold - Supine Hip Adduction Isometric with Ball  - 1 x daily - 7 x weekly - 2 sets - 10 reps - 3 hold - Supine Hip Abduction  - 1 x daily - 7 x weekly - 2 sets - 10 reps - 2 hold - Supine March with Resistance Band  - 1 x daily - 7 x weekly - 2 sets - 10 reps - 2 hold - Squat with Counter Support  - 1 x daily - 7 x weekly - 2 sets - 10 reps - Supine Shoulder Press AAROM in Abduction with Dowel  - 1 x daily - 7 x weekly - 2 sets - 10 reps - 2 hold - Supine Shoulder Horizontal Abduction with Resistance  - 1 x daily - 7 x weekly - 2 sets - 10 reps - 2 hold - Supine 90/90 Shoulder Extension with Resistance  - 1 x daily - 7 x weekly - 2 sets - 10 reps - 2 hold - Dead Bug  - 1 x daily - 7 x weekly - 2 sets - 10 reps - 2 hold - Prone Alternating Arm and Leg Lifts  - 1 x daily - 7 x weekly - 2 sets - 10 reps - 2 hold - Standard Plank  - 1 x daily - 7 x weekly - 1 sets - 5 reps - 10 hold - Sidelying Short Adductor Forearm Plank  - 1 x daily - 7 x  weekly - 1 sets - 5 reps - 10 hold - Supine Hamstring Stretch with Strap  - 1 x daily - 7 x weekly - 1 sets - 2-3 reps - 20 hold - Supine Hip Abduction AROM  - 1 x daily - 7 x weekly - 1 sets - 2-3 reps - 20 hold - Long Sitting Calf Stretch with Strap  - 1 x daily - 7 x weekly - 3 sets - 10 reps - Long Sitting Hamstring Stretch  - 1 x daily - 7 x weekly - 3 sets - 3 reps - 20 hold - Sidelying Hip Extension PROM with Caregiver  -  1 x daily - 7 x weekly - 1 sets - 3 reps - 20 hold ASSESSMENT:  CLINICAL IMPRESSION: PT was completed for flexibility therex to continue to assist with minimizing pt's L hip pain. Like the strengthening therex started, the pt has not participated in this form of exercise in the past and is excited to see how stretching helps her. Pt voices understanding of the stretches and how to assist her caregiver with their application. The caregiver returned proper demonstration of the stretches. Pt tolerated PT today without adverse effects. Pt will continue to benefit from skilled PT to address impairments for improved function with minimized pain. Will assess pt and caregiver's understanding of the stretches the next PT session.  EVAL: Patient is a 38 y.o. female who was seen today for physical therapy evaluation and treatment for M25.562 left knee pain and M25.552 left hip pain. Pt's L LE pain appears primarily related to adapting a prolonged position of 1/2 kneeling on her L knee which helps her trunk positioning to breathe better. With maintaining this position over a prolonged time frame, the pt experiences sharp L knee and hip pain when she straightens her L leg out moving from this position. The pain will last approx 5-10 mins to resolve. Pt's primary means of mobility is a power WC, with pt only occasionally using a 4 wheeled RW. Pt Ed (self care) was provided as above. Pt will benefit from skilled PT 1w8 to address impairments and establish a HEP (mobility and posture to assist  quality of breathing) to optimize function and QOL with less pain.   .  OBJECTIVE IMPAIRMENTS: decreased activity tolerance, postural dysfunction, and pain.   ACTIVITY LIMITATIONS:  Quality of breathing  PARTICIPATION LIMITATIONS: occupation and ADLs  PERSONAL FACTORS: Diastrophic dysplasia, scoliosis, kyphosis are also affecting patient's functional outcome.   REHAB POTENTIAL: Good  CLINICAL DECISION MAKING: Stable/uncomplicated  EVALUATION COMPLEXITY: Low   GOALS:  SHORT TERM GOALS: Target date: 10/18/22 Pt will be Ind in an initial HEP  Baseline: TBD Goal status: 10/24/23 MET  2.  Pt will report 25% or greater improvement in her L knee and hip pain for improved quality of life Baseline:  10/24/23: 30% 42/25: 70% improvement Goal status: MET  LONG TERM GOALS: Target date: 12/06/23  Pt will be Ind in a final HEP to maintain achieved LOF  Baseline: TBD Goal status: ONGOING  2.  Pain of the L knee and hip with moving L leg out from prolonged positioning of 1/2 kneeling on L knee will decrease by 75% or greater for improved QOL Baseline: 8/10 10/24/23: 2-3/10, with the high 6/10 intrmittently 11/13/23: 70% improvement Goal status: IMPROVING  3.  Pt will reports 80% or greater compliance of alternating her sitting positions every 15 mins to minimize the strain and pain on the L knee and hip Baseline: Not shifting 10/24/23: Completing consistently Goal status: IMPROVING   PLAN:  PT FREQUENCY: 1x/week  PT DURATION: 8 weeks  PLANNED INTERVENTIONS: 97164- PT Re-evaluation, 97110-Therapeutic exercises, 97530- Therapeutic activity, 97535- Self Care, 16109- Manual therapy, Patient/Family education, Joint mobilization, Cryotherapy, and Moist heat  PLAN FOR NEXT SESSION: Establish HEP; progress therex as indicated; use of modalities, manual therapy; and TPDN as indicated.  Abeer Iversen MS, PT 11/28/23 6:54 PM

## 2023-11-27 ENCOUNTER — Ambulatory Visit: Payer: Medicaid Other

## 2023-11-27 DIAGNOSIS — M25562 Pain in left knee: Secondary | ICD-10-CM | POA: Diagnosis not present

## 2023-11-27 DIAGNOSIS — R293 Abnormal posture: Secondary | ICD-10-CM

## 2023-11-27 DIAGNOSIS — G8929 Other chronic pain: Secondary | ICD-10-CM

## 2023-12-03 NOTE — Therapy (Signed)
 OUTPATIENT PHYSICAL THERAPY LOWER EXTREMITY TREATMENT/RE-CERT/RE-AUTH   Patient Name: Cindy Goodwin MRN: [id number] DOB:[birth year], 38 y.o., female Today's Date: 12/04/2023  END OF SESSION:  PT End of Session - 12/04/23 1600     Visit Number 8    Number of Visits 9    Date for PT Re-Evaluation 01/24/24    Authorization Type MEDICAID OF [place]    Authorization - Visit Number 5    Authorization - Number of Visits 6    PT Start Time 0350    PT Stop Time 0430    PT Time Calculation (min) 40 min    Activity Tolerance Patient tolerated treatment well    Behavior During Therapy [facility] for tasks assessed/performed                    Past Medical History:  Diagnosis Date   Diastrophic dysplasia    Kyphosis    Mild persistent asthma 12/31/2017   Palpitations    Scoliosis    Past Surgical History:  Procedure Laterality Date   HYSTERECTOMY ABDOMINAL WITH SALPINGECTOMY Bilateral 10/17/2021   Procedure: HYSTERECTOMY ABDOMINAL WITH SALPINGECTOMY;  Surgeon: Lenord Radon, MD;  Location: [facility] OR;  Service: Gynecology;  Laterality: Bilateral;   NO PAST SURGERIES     Patient Active Problem List   Diagnosis Date Noted   Acute on chronic blood loss anemia 10/19/2021   Fibroids    Post-operative state 10/17/2021   Fibroids, intramural 10/17/2021   Anemia due to chronic blood loss 10/17/2021   Dwarfism 10/17/2021   Menorrhagia with regular cycle 10/17/2021   Cough, persistent 12/31/2017   Nonallergic rhinitis 12/31/2017   Mild persistent asthma/cough variant asthma 12/31/2017   Chronic rhinitis 12/31/2017    PCP: Charle Congo, MD   REFERRING PROVIDER: Charle Congo, MD   REFERRING DIAG: M25.562 left knee pain and M25.552 left hip pain  THERAPY DIAG:  Chronic pain of left knee  Chronic left hip pain  Abnormal posture  Rationale for Evaluation and Treatment: Rehabilitation  ONSET DATE: 8 months  SUBJECTIVE:   SUBJECTIVE STATEMENT: Pt reports being very  pleased with PT: Her L hip pain is 75-80% better, her posture and breathing are improvement, and her confidence with mobility is higher. With having diastrophic dysplasia, she feels like she is learning about her body and what exercise can do for her. Regarding stretching, she initially experienced popping sensations, now her motion feel more free.  EVAL: Pt reports experiencing L knee and hip pain when she moves out of the prolonged position of 1/2 kneeling on her L knee. Pt states she adopts this position in her wheelchair because when sitting fully she feels like she is not able to breathe as well due to her scoliosis.   PERTINENT HISTORY: Diastrophic dysplasia, scoliosis, kyphosis  PAIN: 11/27/23=0/10 with the high at 5/10 intermittently Are you having pain? Yes: NPRS scale: 6-8/10 Pain location: Primarily the L hip Pain description: sharp, intermittent Aggravating factors: Moving out of the prolonged sitting position Relieving factors: Resolves after unfolding  PRECAUTIONS: None  RED FLAGS: None   WEIGHT BEARING RESTRICTIONS: No  FALLS:  Has patient fallen in last 6 months? No  LIVING ENVIRONMENT: Lives with: lives with an adult companion Lives in: House/apartment  Level entry Has following equipment at home: Environmental consultant - 4 wheeled, Wheelchair (power), and shower chair  OCCUPATION: Artist  PLOF: Independent with household mobility with device and Independent with community mobility with device  PATIENT GOALS: To eliminate the pain  NEXT MD  VISIT: Not scheduled  OBJECTIVE:  Note: Objective measures were completed at Evaluation unless otherwise noted.  DIAGNOSTIC FINDINGS: NA  PATIENT SURVEYS:  Patient Specific Functional Scale: Pain of the L knee and hip with moving L leg out from prolonged positioning of 1/2  kneeling on L knee= 8/10.  COGNITION: Overall cognitive status: Within functional limits for tasks assessed     SENSATION: WFL  EDEMA:  NA  MUSCLE  LENGTH: Hamstrings: Right NT deg; Left NT deg Andy Bannister test: Right NT deg; Left NT deg  POSTURE: increased thoracic kyphosis and scoliosis  PALPATION: TTP to the medial knee and lateral hip  LOWER EXTREMITY ROM:  Equal to the R and grossly WNLs Active ROM Right eval Left eval  Hip flexion    Hip extension    Hip abduction    Hip adduction    Hip internal rotation    Hip external rotation    Knee flexion    Knee extension    Ankle dorsiflexion    Ankle plantarflexion    Ankle inversion    Ankle eversion     (Blank rows = not tested)  LOWER EXTREMITY MMT:  Equal to the R and grossly WNLs MMT Right eval Left eval  Hip flexion    Hip extension    Hip abduction    Hip adduction    Hip internal rotation    Hip external rotation    Knee flexion    Knee extension    Ankle dorsiflexion    Ankle plantarflexion    Ankle inversion    Ankle eversion     (Blank rows = not tested)  LOWER EXTREMITY SPECIAL TESTS:  Hip special tests: Portia Brittle (FABER) test: negative and Hip scouring test: negative Knee special tests: McMurray's test: negative  FUNCTIONAL TESTS:  NT  GAIT: Distance walked: 5 ft Assistive device utilized: Walker - 4 wheeled Level of assistance: Modified independence Comments: WNLs for condition                                                                                                                              TREATMENT DATE:  [facility] Adult PT Treatment:                                                DATE: 12/04/23 Therapeutic Exercise: Supine SLR stretch per PT assist and per pt c strap Supine long sitting stretch for both legs Supine hip adductor stretches per PT and caregiver assist Prone hip flexor stretches per PT and caregiver assist Therapeutic Activity: Seated lat pulls 2x10 RTB Seated shoulder horz abd 2x10 RTB Long sitting stretch for both legs Long sitting lateral trunk stretches c deep breathes Long sitting trunk flexion and ext  stretches c deep breathes  [facility] Adult PT Treatment:  DATE: 11/27/23 Therapeutic Exercise: Supine SLR stretch per PT assist and per pt c strap Supine long sitting stretch for both legs Supine hip adductor stretches per PT and caregiver assist Supine lying on rolled towel spinal lengthening Prone hip flexor stretches per PT and caregiver assist Self Care: Education to caregiver with verbal and tactile cueing to provide stretching to pt as noted above  [facility] Adult PT Treatment:                                                DATE: 11/13/23 Therapeutic Exercise: Bridging with square knee bolster 2x10 Supine hip abd 2x10 RTB Supine hip alt flexion 2x10 RTB Supine chest press 2x10 8# Supine Lat pull downs 2x10 RTB Supine shoulder horz abd 2x10 RTB Dead bugs 2x10 Prone alt arm/leg lifts Prone planks c bolster x5 10' Reverse side planks c bolster at feet x5 10" each Sit t/f stand with RW 2x10 Updated  PATIENT EDUCATION:  Education details: Eval findings, POC, HEP Person educated: Patient and friend Education method: Medical illustrator Education comprehension: verbalized understanding  HOME EXERCISE PROGRAM: Access Code: [email or web address] URL: [email or web address] Date: 11/27/2023 Prepared by: Liborio Reeds  Exercises - Hip Flexion  - 1 x daily - 7 x weekly - 2 sets - 10 reps - Supine Bridge  - 1 x daily - 7 x weekly - 2 sets - 10 reps - 3 hold - Supine Hip Adduction Isometric with Ball  - 1 x daily - 7 x weekly - 2 sets - 10 reps - 3 hold - Supine Hip Abduction  - 1 x daily - 7 x weekly - 2 sets - 10 reps - 2 hold - Supine March with Resistance Band  - 1 x daily - 7 x weekly - 2 sets - 10 reps - 2 hold - Squat with Counter Support  - 1 x daily - 7 x weekly - 2 sets - 10 reps - Supine Shoulder Press AAROM in Abduction with Dowel  - 1 x daily - 7 x weekly - 2 sets - 10 reps - 2 hold - Supine Shoulder Horizontal Abduction  with Resistance  - 1 x daily - 7 x weekly - 2 sets - 10 reps - 2 hold - Supine 90/90 Shoulder Extension with Resistance  - 1 x daily - 7 x weekly - 2 sets - 10 reps - 2 hold - Dead Bug  - 1 x daily - 7 x weekly - 2 sets - 10 reps - 2 hold - Prone Alternating Arm and Leg Lifts  - 1 x daily - 7 x weekly - 2 sets - 10 reps - 2 hold - Standard Plank  - 1 x daily - 7 x weekly - 1 sets - 5 reps - 10 hold - Sidelying Short Adductor Forearm Plank  - 1 x daily - 7 x weekly - 1 sets - 5 reps - 10 hold - Supine Hamstring Stretch with Strap  - 1 x daily - 7 x weekly - 1 sets - 2-3 reps - 20 hold - Supine Hip Abduction AROM  - 1 x daily - 7 x weekly - 1 sets - 2-3 reps - 20 hold - Long Sitting Calf Stretch with Strap  - 1 x daily - 7 x weekly - 3 sets - 10 reps - Long Sitting Hamstring Stretch  -  1 x daily - 7 x weekly - 3 sets - 3 reps - 20 hold - Sidelying Hip Extension PROM with Caregiver  - 1 x daily - 7 x weekly - 1 sets - 3 reps - 20 hold ASSESSMENT:  CLINICAL IMPRESSION: PT was completed for flexibility, and strengthening therex with increased balance demand completing some exs in the less stable position of long sitting. With the initiation of exercise for someone with diastrophic dysplasia, Ashira, who has not exercised before, is experiencing many benefits of exercise beyond the reduction her L hip pain. Pt will continue to benefit from skilled PT 2w6 to address strength, balance confidence, and walking tolerance for improved functional mobility.    EVAL: Patient is a 38 y.o. female who was seen today for physical therapy evaluation and treatment for M25.562 left knee pain and M25.552 left hip pain. Pt's L LE pain appears primarily related to adapting a prolonged position of 1/2 kneeling on her L knee which helps her trunk positioning to breathe better. With maintaining this position over a prolonged time frame, the pt experiences sharp L knee and hip pain when she straightens her L leg out moving from  this position. The pain will last approx 5-10 mins to resolve. Pt's primary means of mobility is a power WC, with pt only occasionally using a 4 wheeled RW. Pt Ed (self care) was provided as above. Pt will benefit from skilled PT 1w8 to address impairments and establish a HEP (mobility and posture to assist quality of breathing) to optimize function and QOL with less pain.   .  OBJECTIVE IMPAIRMENTS: decreased activity tolerance, postural dysfunction, and pain.   ACTIVITY LIMITATIONS:  Quality of breathing  PARTICIPATION LIMITATIONS: occupation and ADLs  PERSONAL FACTORS: Diastrophic dysplasia, scoliosis, kyphosis are also affecting patient's functional outcome.   REHAB POTENTIAL: Good  CLINICAL DECISION MAKING: Stable/uncomplicated  EVALUATION COMPLEXITY: Low   GOALS:  SHORT TERM GOALS: Target date: 10/18/22 Pt will be Ind in an initial HEP  Baseline: TBD Goal status: 10/24/23 MET  2.  Pt will report 25% or greater improvement in her L knee and hip pain for improved quality of life Baseline:  10/24/23: 30% 42/25: 70% improvement Goal status: MET  LONG TERM GOALS: Target date: 12/06/23  Pt will be Ind in a final HEP to maintain achieved LOF  Baseline: TBD Goal status: Still developing  2.  Pain of the L knee and hip with moving L leg out from prolonged positioning of 1/2 kneeling on L knee will decrease by 75% or greater for improved QOL Baseline: 8/10 10/24/23: 2-3/10, with the high 6/10 intrmittently 11/13/23: 70% improvement 12/04/23: 75 to 80% Goal status: MET  3.  Pt will reports 80% or greater compliance of alternating her sitting positions every 15 mins to minimize the strain and pain on the L knee and hip Baseline: Not shifting 10/24/23: Completing consistently 12/04/23:Pt reports every 15 to 20 mins Goal status: MET   New LTG Target date: 01/24/24  4. Pt will report 50% or greater confidence with standing, sitting, and reaching from her WC for improved function and  safety. Baseline:  Goal status: New goal 12/04/23  5. Pt will walk 10 mins every day for cardiovascular benefits Baseline: 5 mins every other day Goal status: New goal 12/04/23   6. Improve 5xSTS by MCID as indication of improved functional mobility  Baseline: TBA Goal status: New goal 12/04/23   PLAN:  PT FREQUENCY: 1x/week  PT DURATION: 8 weeks  PLANNED INTERVENTIONS: 97164- PT Re-evaluation, 97110-Therapeutic exercises, 97530- Therapeutic activity, 97535- Self Care, 16109- Manual therapy, Patient/Family education, Joint mobilization, Cryotherapy, and Moist heat  PLAN FOR NEXT SESSION: Establish HEP; progress therex as indicated; use of modalities, manual therapy; and TPDN as indicated.  Zyair Rhein MS, PT 12/04/23 6:29 PM

## 2023-12-04 ENCOUNTER — Ambulatory Visit

## 2023-12-04 DIAGNOSIS — G8929 Other chronic pain: Secondary | ICD-10-CM

## 2023-12-04 DIAGNOSIS — M25562 Pain in left knee: Secondary | ICD-10-CM | POA: Diagnosis not present

## 2023-12-04 DIAGNOSIS — R293 Abnormal posture: Secondary | ICD-10-CM

## 2023-12-10 NOTE — Therapy (Signed)
 OUTPATIENT PHYSICAL THERAPY LOWER EXTREMITY TREATMENT/RE-CERT/RE-AUTH   Patient Name: Cindy Goodwin MRN: [id number] DOB:1985-09-21, 38 y.o., female Today's Date: 12/11/2023  END OF SESSION:  PT End of Session - 12/11/23 1622     Visit Number 9    Number of Visits 16    Date for PT Re-Evaluation 01/24/24    Authorization Type MEDICAID OF [place]    Authorization Time Period 8 visit    Authorization - Visit Number 6    Authorization - Number of Visits 13    PT Start Time 0315    PT Stop Time 0355    PT Time Calculation (min) 40 min    Activity Tolerance Patient tolerated treatment well    Behavior During Therapy [facility] for tasks assessed/performed                     Past Medical History:  Diagnosis Date   Diastrophic dysplasia    Kyphosis    Mild persistent asthma 12/31/2017   Palpitations    Scoliosis    Past Surgical History:  Procedure Laterality Date   HYSTERECTOMY ABDOMINAL WITH SALPINGECTOMY Bilateral 10/17/2021   Procedure: HYSTERECTOMY ABDOMINAL WITH SALPINGECTOMY;  Surgeon: Lenord Radon, MD;  Location: [facility] OR;  Service: Gynecology;  Laterality: Bilateral;   NO PAST SURGERIES     Patient Active Problem List   Diagnosis Date Noted   Acute on chronic blood loss anemia 10/19/2021   Fibroids    Post-operative state 10/17/2021   Fibroids, intramural 10/17/2021   Anemia due to chronic blood loss 10/17/2021   Dwarfism 10/17/2021   Menorrhagia with regular cycle 10/17/2021   Cough, persistent 12/31/2017   Nonallergic rhinitis 12/31/2017   Mild persistent asthma/cough variant asthma 12/31/2017   Chronic rhinitis 12/31/2017    PCP: Charle Congo, MD   REFERRING PROVIDER: Charle Congo, MD   REFERRING DIAG: M25.562 left knee pain and M25.552 left hip pain  THERAPY DIAG:  Chronic pain of left knee  Chronic left hip pain  Abnormal posture  Rationale for Evaluation and Treatment: Rehabilitation  ONSET DATE: 8 months  SUBJECTIVE:    SUBJECTIVE STATEMENT: Pt reports her ability to transfer from her WC to other surfaces is improving.  EVAL: Pt reports experiencing L knee and hip pain when she moves out of the prolonged position of 1/2 kneeling on her L knee. Pt states she adopts this position in her wheelchair because when sitting fully she feels like she is not able to breathe as well due to her scoliosis.   PERTINENT HISTORY: Diastrophic dysplasia, scoliosis, kyphosis  PAIN: 11/27/23=0/10 with the high at 5/10 intermittently Are you having pain? Yes: NPRS scale: 6-8/10 Pain location: Primarily the L hip Pain description: sharp, intermittent Aggravating factors: Moving out of the prolonged sitting position Relieving factors: Resolves after unfolding  PRECAUTIONS: None  RED FLAGS: None   WEIGHT BEARING RESTRICTIONS: No  FALLS:  Has patient fallen in last 6 months? No  LIVING ENVIRONMENT: Lives with: lives with an adult companion Lives in: House/apartment  Level entry Has following equipment at home: Environmental consultant - 4 wheeled, Wheelchair (power), and shower chair  OCCUPATION: Artist  PLOF: Independent with household mobility with device and Independent with community mobility with device  PATIENT GOALS: To eliminate the pain  NEXT MD VISIT: Not scheduled  OBJECTIVE:  Note: Objective measures were completed at Evaluation unless otherwise noted.  DIAGNOSTIC FINDINGS: NA  PATIENT SURVEYS:  Patient Specific Functional Scale: Pain of the L knee and hip with moving  L leg out from prolonged positioning of 1/2  kneeling on L knee= 8/10.  COGNITION: Overall cognitive status: Within functional limits for tasks assessed     SENSATION: WFL  EDEMA:  NA  MUSCLE LENGTH: Hamstrings: Right NT deg; Left NT deg Andy Bannister test: Right NT deg; Left NT deg  POSTURE: increased thoracic kyphosis and scoliosis  PALPATION: TTP to the medial knee and lateral hip  LOWER EXTREMITY ROM:  Equal to the R and grossly  WNLs Active ROM Right eval Left eval  Hip flexion    Hip extension    Hip abduction    Hip adduction    Hip internal rotation    Hip external rotation    Knee flexion    Knee extension    Ankle dorsiflexion    Ankle plantarflexion    Ankle inversion    Ankle eversion     (Blank rows = not tested)  LOWER EXTREMITY MMT:  Equal to the R and grossly WNLs MMT Right eval Left eval  Hip flexion    Hip extension    Hip abduction    Hip adduction    Hip internal rotation    Hip external rotation    Knee flexion    Knee extension    Ankle dorsiflexion    Ankle plantarflexion    Ankle inversion    Ankle eversion     (Blank rows = not tested)  LOWER EXTREMITY SPECIAL TESTS:  Hip special tests: Portia Brittle (FABER) test: negative and Hip scouring test: negative Knee special tests: McMurray's test: negative  FUNCTIONAL TESTS:  NT  GAIT: Distance walked: 5 ft Assistive device utilized: Walker - 4 wheeled Level of assistance: Modified independence Comments: WNLs for condition                                                                                                                              TREATMENT DATE:  [facility] Adult PT Treatment:                                                DATE: 12/11/23 Therapeutic Exercise: Supine hip abd 2x10 RTB Supine hip alt flexion 2x10 RTB Supine chest press 2x20 10# Prone alt arm/leg lifts 2x20 Therapeutic Activity: 108' c RW Seated lat pulls 2x10 RTB Seated shoulder horz abd 2x10 RTB  [facility] Adult PT Treatment:                                                DATE: 12/04/23 Therapeutic Exercise: Supine SLR stretch per PT assist and per pt c strap Supine long sitting stretch for both legs Supine hip adductor stretches per PT and caregiver assist Prone  hip flexor stretches per PT and caregiver assist Therapeutic Activity: Seated lat pulls 2x10 RTB Seated shoulder horz abd 2x10 RTB Long sitting stretch for both legs Long sitting  lateral trunk stretches c deep breathes Long sitting trunk flexion and ext stretches c deep breathes  PATIENT EDUCATION:  Education details: Eval findings, POC, HEP Person educated: Patient and friend Education method: Medical illustrator Education comprehension: verbalized understanding  HOME EXERCISE PROGRAM: Access Code: [email or web address] URL: [email or web address] Date: 11/27/2023 Prepared by: Liborio Reeds  Exercises - Hip Flexion  - 1 x daily - 7 x weekly - 2 sets - 10 reps - Supine Bridge  - 1 x daily - 7 x weekly - 2 sets - 10 reps - 3 hold - Supine Hip Adduction Isometric with Ball  - 1 x daily - 7 x weekly - 2 sets - 10 reps - 3 hold - Supine Hip Abduction  - 1 x daily - 7 x weekly - 2 sets - 10 reps - 2 hold - Supine March with Resistance Band  - 1 x daily - 7 x weekly - 2 sets - 10 reps - 2 hold - Squat with Counter Support  - 1 x daily - 7 x weekly - 2 sets - 10 reps - Supine Shoulder Press AAROM in Abduction with Dowel  - 1 x daily - 7 x weekly - 2 sets - 10 reps - 2 hold - Supine Shoulder Horizontal Abduction with Resistance  - 1 x daily - 7 x weekly - 2 sets - 10 reps - 2 hold - Supine 90/90 Shoulder Extension with Resistance  - 1 x daily - 7 x weekly - 2 sets - 10 reps - 2 hold - Dead Bug  - 1 x daily - 7 x weekly - 2 sets - 10 reps - 2 hold - Prone Alternating Arm and Leg Lifts  - 1 x daily - 7 x weekly - 2 sets - 10 reps - 2 hold - Standard Plank  - 1 x daily - 7 x weekly - 1 sets - 5 reps - 10 hold - Sidelying Short Adductor Forearm Plank  - 1 x daily - 7 x weekly - 1 sets - 5 reps - 10 hold - Supine Hamstring Stretch with Strap  - 1 x daily - 7 x weekly - 1 sets - 2-3 reps - 20 hold - Supine Hip Abduction AROM  - 1 x daily - 7 x weekly - 1 sets - 2-3 reps - 20 hold - Long Sitting Calf Stretch with Strap  - 1 x daily - 7 x weekly - 3 sets - 10 reps - Long Sitting Hamstring Stretch  - 1 x daily - 7 x weekly - 3 sets - 3 reps - 20 hold - Sidelying Hip  Extension PROM with Caregiver  - 1 x daily - 7 x weekly - 1 sets - 3 reps - 20 hold ASSESSMENT:  CLINICAL IMPRESSION: PT was completed for flexibility, strengthening, and balance. with a RW was assessed. Pt notes her mobility with transfers to/from her [facility] with other surfaces is improving with her feeling more confident. Pt tolerated the prescribed exercises without adverse effects.  EVAL: Patient is a 38 y.o. female who was seen today for physical therapy evaluation and treatment for M25.562 left knee pain and M25.552 left hip pain. Pt's L LE pain appears primarily related to adapting a prolonged position of 1/2 kneeling on her L knee which helps her trunk positioning  to breathe better. With maintaining this position over a prolonged time frame, the pt experiences sharp L knee and hip pain when she straightens her L leg out moving from this position. The pain will last approx 5-10 mins to resolve. Pt's primary means of mobility is a power WC, with pt only occasionally using a 4 wheeled RW. Pt Ed (self care) was provided as above. Pt will benefit from skilled PT 1w8 to address impairments and establish a HEP (mobility and posture to assist quality of breathing) to optimize function and QOL with less pain.   .  OBJECTIVE IMPAIRMENTS: decreased activity tolerance, postural dysfunction, and pain.   ACTIVITY LIMITATIONS:  Quality of breathing  PARTICIPATION LIMITATIONS: occupation and ADLs  PERSONAL FACTORS: Diastrophic dysplasia, scoliosis, kyphosis are also affecting patient's functional outcome.   REHAB POTENTIAL: Good  CLINICAL DECISION MAKING: Stable/uncomplicated  EVALUATION COMPLEXITY: Low   GOALS:  SHORT TERM GOALS: Target date: 10/18/22 Pt will be Ind in an initial HEP  Baseline: TBD Goal status: 10/24/23 MET  2.  Pt will report 25% or greater improvement in her L knee and hip pain for improved quality of life Baseline:  10/24/23: 30% 42/25: 70% improvement Goal status:  MET  LONG TERM GOALS: Target date: 12/06/23  Pt will be Ind in a final HEP to maintain achieved LOF  Baseline: TBD Goal status: Still developing  2.  Pain of the L knee and hip with moving L leg out from prolonged positioning of 1/2 kneeling on L knee will decrease by 75% or greater for improved QOL Baseline: 8/10 10/24/23: 2-3/10, with the high 6/10 intrmittently 11/13/23: 70% improvement 12/04/23: 75 to 80% Goal status: MET  3.  Pt will reports 80% or greater compliance of alternating her sitting positions every 15 mins to minimize the strain and pain on the L knee and hip Baseline: Not shifting 10/24/23: Completing consistently 12/04/23:Pt reports every 15 to 20 mins Goal status: MET   New LTG Target date: 01/24/24  4. Pt will report 50% or greater confidence with standing, sitting, and reaching from her WC for improved function and safety. Baseline:  Goal status: New goal 12/04/23  5. Pt will walk 10 mins every day for cardiovascular benefits Baseline: 5 mins every other day Goal status: New goal 12/04/23   6. Improve 5xSTS by MCID as indication of improved functional mobility  Baseline: TBA Goal status: New goal 12/04/23   PLAN:  PT FREQUENCY: 1x/week  PT DURATION: 8 weeks  PLANNED INTERVENTIONS: 97164- PT Re-evaluation, 97110-Therapeutic exercises, 97530- Therapeutic activity, 97535- Self Care, 10272- Manual therapy, Patient/Family education, Joint mobilization, Cryotherapy, and Moist heat  PLAN FOR NEXT SESSION: Establish HEP; progress therex as indicated; use of modalities, manual therapy; and TPDN as indicated.  Adna Nofziger MS, PT 12/11/23 6:09 PM

## 2023-12-11 ENCOUNTER — Ambulatory Visit

## 2023-12-11 DIAGNOSIS — G8929 Other chronic pain: Secondary | ICD-10-CM

## 2023-12-11 DIAGNOSIS — R293 Abnormal posture: Secondary | ICD-10-CM

## 2023-12-11 DIAGNOSIS — M25562 Pain in left knee: Secondary | ICD-10-CM | POA: Diagnosis not present

## 2024-03-13 ENCOUNTER — Encounter: Payer: Self-pay | Admitting: Physician Assistant

## 2024-05-12 ENCOUNTER — Other Ambulatory Visit

## 2024-05-12 ENCOUNTER — Encounter: Payer: Self-pay | Admitting: Physician Assistant

## 2024-05-12 ENCOUNTER — Ambulatory Visit: Admitting: Physician Assistant

## 2024-05-12 DIAGNOSIS — D649 Anemia, unspecified: Secondary | ICD-10-CM | POA: Insufficient documentation

## 2024-05-12 DIAGNOSIS — R1084 Generalized abdominal pain: Secondary | ICD-10-CM | POA: Diagnosis not present

## 2024-05-12 DIAGNOSIS — D509 Iron deficiency anemia, unspecified: Secondary | ICD-10-CM

## 2024-05-12 DIAGNOSIS — K921 Melena: Secondary | ICD-10-CM

## 2024-05-12 LAB — CBC WITH DIFFERENTIAL/PLATELET
Basophils Absolute: 0 K/uL (ref 0.0–0.1)
Basophils Relative: 0.8 % (ref 0.0–3.0)
Eosinophils Absolute: 0.1 K/uL (ref 0.0–0.7)
Eosinophils Relative: 2.1 % (ref 0.0–5.0)
HCT: 40.4 % (ref 36.0–46.0)
Hemoglobin: 13.3 g/dL (ref 12.0–15.0)
Lymphocytes Relative: 25 % (ref 12.0–46.0)
Lymphs Abs: 1.5 K/uL (ref 0.7–4.0)
MCHC: 33 g/dL (ref 30.0–36.0)
MCV: 88.9 fl (ref 78.0–100.0)
Monocytes Absolute: 0.5 K/uL (ref 0.1–1.0)
Monocytes Relative: 8.4 % (ref 3.0–12.0)
Neutro Abs: 3.9 K/uL (ref 1.4–7.7)
Neutrophils Relative %: 63.7 % (ref 43.0–77.0)
Platelets: 293 K/uL (ref 150.0–400.0)
RBC: 4.54 Mil/uL (ref 3.87–5.11)
RDW: 15.2 % (ref 11.5–15.5)
WBC: 6.2 K/uL (ref 4.0–10.5)

## 2024-05-12 LAB — IBC + FERRITIN
Ferritin: 17.1 ng/mL (ref 10.0–291.0)
Iron: 162 ug/dL — ABNORMAL HIGH (ref 42–145)
Saturation Ratios: 46.1 % (ref 20.0–50.0)
TIBC: 351.4 ug/dL (ref 250.0–450.0)
Transferrin: 251 mg/dL (ref 212.0–360.0)

## 2024-05-12 MED ORDER — PANTOPRAZOLE SODIUM 40 MG PO TBEC: 40.0000 mg | DELAYED_RELEASE_TABLET | Freq: Every day | ORAL | 1 refills | Status: DC

## 2024-05-12 NOTE — Patient Instructions (Signed)
 Your provider has requested that you go to the basement level for lab work before leaving today. Press B on the elevator. The lab is located at the first door on the left as you exit the elevator.  We have sent the following medications to your pharmacy for you to pick up at your convenience: Pantoprazole  40 mg once daily  You have been scheduled for an Endoscopy. Please follow written instructions given to you at your visit today.  If you use inhalers (even only as needed), please bring them with you on the day of your procedure.  If you take any of the following medications, they will need to be adjusted prior to your procedure:   DO NOT TAKE 7 DAYS PRIOR TO TEST- Trulicity (dulaglutide) Ozempic, Wegovy (semaglutide) Mounjaro (tirzepatide) Bydureon Bcise (exanatide extended release)  DO NOT TAKE 1 DAY PRIOR TO YOUR TEST Rybelsus (semaglutide) Adlyxin (lixisenatide) Victoza (liraglutide) Byetta (exanatide) ___________________________________________________________________________  Please follow up sooner if symptoms increase or worsen  Due to recent changes in healthcare laws, you may see the results of your imaging and laboratory studies on MyChart before your provider has had a chance to review them.  We understand that in some cases there may be results that are confusing or concerning to you. Not all laboratory results come back in the same time frame and the provider may be waiting for multiple results in order to interpret others.  Please give us  48 hours in order for your provider to thoroughly review all the results before contacting the office for clarification of your results.   Thank you for trusting me with your gastrointestinal care!   [name], PA-C _______________________________________________________  If your blood pressure at your visit was 140/90 or greater, please contact your primary care physician to follow up on  this.  _______________________________________________________  If you are age 39 or older, your body mass index should be between 23-30. Your There is no height or weight on file to calculate BMI. If this is out of the aforementioned range listed, please consider follow up with your Primary Care Provider.  If you are age 39 or younger, your body mass index should be between 19-25. Your There is no height or weight on file to calculate BMI. If this is out of the aformentioned range listed, please consider follow up with your Primary Care Provider.   ________________________________________________________  The [facility] providers would like to encourage you to use MYCHART to communicate with providers for non-urgent requests or questions.  Due to long hold times on the telephone, sending your provider a message by [facility] may be a faster and more efficient way to get a response.  Please allow 48 business hours for a response.  Please remember that this is for non-urgent requests.  _______________________________________________________

## 2024-05-12 NOTE — Progress Notes (Signed)
 [name], PA-C [address] [place], [place]  [address] Phone: [phone]   Gastroenterology Consultation  Referring Provider:     Shelda Atlas, MD Primary Care Physician:  Shelda Atlas, MD Primary Gastroenterologist:  [name], PA-C / Glendia Holt, MD / Elspeth Naval, MD  Reason for Consultation:     Melena        HPI:   Cindy Goodwin is a 38 y.o. y/o female referred for consultation & management  by Shelda Atlas, MD. patient is wheelchair dependent.  Medical history significant for dwarfism.  She is here today with her friend Cindy Goodwin).  New patient.  Referred to evaluate melena.  6 months ago patient had an episode of black tarry stool.  She saw her PCP and was Hemoccult positive.  She continues to see intermittent episodes of dark stools.  She does not take Pepto-Bismol.  Recently diagnosed with iron deficiency anemia and was started on iron per PCP Dr. Dail.  Patient admits to nausea and occasional episode of vomiting.  Denies bright red rectal bleeding.  Patient was taking aspirin  and ibuprofen  however she has stopped all NSAIDs and aspirin .  Typically has 1-3 formed bowel movements daily.  Currently taking Pepcid  for acid reflux.  Has not been on a PPI.  No previous EGD or GI evaluation.  Past Medical History:  Diagnosis Date   Allergic rhinitis    Anxiety    Cyst of right kidney    Diastrophic dysplasia    Dwarfism    Dysmenorrhea    Episodic tension-type headache, not intractable    Esophageal reflux    Hearing loss    Hyperlipidemia    Kidney stone    Kyphosis    Mild persistent asthma 12/31/2017   Palpitations    Scoliosis    Sleep apnea    Vertigo     Past Surgical History:  Procedure Laterality Date   HYSTERECTOMY ABDOMINAL WITH SALPINGECTOMY Bilateral 10/17/2021   Procedure: HYSTERECTOMY ABDOMINAL WITH SALPINGECTOMY;  Surgeon: Corene Coy, MD;  Location: [facility] OR;  Service: Gynecology;  Laterality:  Bilateral;    Prior to Admission medications   Medication Sig Start Date End Date Taking? Authorizing Provider  albuterol  (PROVENTIL  HFA;VENTOLIN  HFA) 108 (90 Base) MCG/ACT inhaler Inhale 1-2 puffs into the lungs every 6 (six) hours as needed for wheezing or shortness of breath. 12/31/17   Bobbitt, Elgin Pepper, MD  dicyclomine  (BENTYL ) 10 MG capsule Take 1 capsule (10 mg total) by mouth 2 (two) times daily as needed for spasms. 09/12/22   LampteyAleene KIDD, MD  famotidine  (PEPCID ) 20 MG tablet Take 1 tablet (20 mg total) by mouth 2 (two) times daily. 04/04/22   Christopher Savannah, PA-C  loratadine (CLARITIN) 10 MG tablet Take 10 mg by mouth daily as needed for allergies.    [provider]  ofloxacin  (FLOXIN ) 0.3 % OTIC solution Place 5 drops into the right ear daily. 09/12/22   LampteyAleene KIDD, MD    Family History  Problem Relation Age of Onset   Allergic rhinitis Maternal Aunt    Allergic rhinitis Maternal Uncle    Cancer Maternal Grandmother    Diabetes Neg Hx    Hypertension Neg Hx      Social History   Tobacco Use   Smoking status: Never   Smokeless tobacco: Never  Vaping Use   Vaping status: Never Used  Substance Use Topics   Alcohol use: Yes    Comment: occasional  Drug use: No    Allergies as of 05/12/2024 - Review Complete 05/12/2024  Allergen Reaction Noted   Bactrim  [sulfamethoxazole -trimethoprim ] Nausea And Vomiting 02/12/2023    Review of Systems:    All systems reviewed and negative except where noted in HPI.   Physical Exam:  BP 114/62   Pulse 75   LMP 10/09/2021 (Exact Date)  Patient's last menstrual period was 10/09/2021 (exact date).  General:   Alert,  Well-developed, well-nourished, pleasant and cooperative in NAD.  Wheelchair dependent.  Short stature and extremities.  Patient was able to get onto the exam table. Lungs:  Respirations even and unlabored.  Clear throughout to auscultation.   No wheezes, crackles, or rhonchi. No acute  distress. Heart:  Regular rate and rhythm; no murmurs, clicks, rubs, or gallops. Abdomen:  Normal bowel sounds.  No bruits.  Soft, and non-distended without masses, hepatosplenomegaly or hernias noted.  Mild left sided abdominal tenderness.  No right sided abdominal tenderness.  No guarding or rebound tenderness.    Rectal: Normal rectal exam.  No external hemorrhoids.  No rectal tenderness or masses.  No stool in the vault.  Hemoccult negative. Neurologic:  Alert and oriented x3. Psych:  Alert and cooperative. Normal mood and affect.  Imaging Studies: No results found.  Labs: CBC    Component Value Date/Time   WBC 6.2 05/12/2024 1539   RBC 4.54 05/12/2024 1539   HGB 13.3 05/12/2024 1539   HGB 11.4 04/05/2022 1416   HCT 40.4 05/12/2024 1539   HCT 36.2 04/05/2022 1416   PLT 293.0 05/12/2024 1539   PLT 310 04/05/2022 1416   MCV 88.9 05/12/2024 1539   MCV 79 04/05/2022 1416    CMP     Component Value Date/Time   NA 136 02/12/2023 0000   NA 139 04/05/2022 1416   K 4.4 02/12/2023 0000   CL 102 02/12/2023 0000   CO2 19 (L) 02/12/2023 0000   GLUCOSE 78 02/12/2023 0000   BUN 8 02/12/2023 0000   BUN 8 04/05/2022 1416   CREATININE 0.41 (L) 02/12/2023 0000   CALCIUM 9.6 02/12/2023 0000   PROT 7.4 02/12/2023 0000   PROT 7.6 04/05/2022 1416   ALBUMIN 4.4 04/05/2022 1416   AST 19 02/12/2023 0000   ALT 7 02/12/2023 0000   ALKPHOS 42 (L) 04/05/2022 1416   BILITOT 0.3 02/12/2023 0000   BILITOT 0.3 04/05/2022 1416   GFRNONAA NOT CALCULATED 10/11/2021 1343   GFRAA NOT CALCULATED 10/11/2012 2309    Assessment and Plan:   Cindy Goodwin is a 38 y.o. y/o female has been referred for:  1.  Melena and history of iron deficiency anemia: Evaluate for upper GI bleed.  I suspect she had stomach ulcer.  Patient has stopped aspirin  and NSAIDs. - Labs: CBC, iron panel - Scheduling EGD at hospital. I discussed risks of EGD with patient to include risk of bleeding, perforation, and risk of  sedation.  Patient expressed understanding and agrees to proceed with EGD.  - She is advised to avoid all NSAIDs and aspirin . - Start pantoprazole  40 Mg 1 tablet once daily. - Stop Pepcid  (famotidine ).  2.  GERD - Stop Pepcid  - Start pantoprazole  40 mg 1 tablet once daily.  3.  Dwarfism /wheelchair dependent - Schedule EGD procedure at hospital for support.  *I discussed patient's case with Dr. Leigh who is here in office today and to help decide patient's plan of care.  Follow up 4 weeks after EGD.  Also follow-up based on test  results.  [name], PA-C

## 2024-05-13 ENCOUNTER — Ambulatory Visit: Payer: Self-pay | Admitting: Physician Assistant

## 2024-05-14 ENCOUNTER — Encounter (HOSPITAL_BASED_OUTPATIENT_CLINIC_OR_DEPARTMENT_OTHER): Payer: Self-pay | Admitting: Internal Medicine

## 2024-05-14 DIAGNOSIS — R0683 Snoring: Secondary | ICD-10-CM

## 2024-05-14 DIAGNOSIS — R5383 Other fatigue: Secondary | ICD-10-CM

## 2024-05-18 NOTE — Progress Notes (Signed)
 Agree with assessment with the following thoughts.  History of iron deficiency anemia, recent labs show that she has responded to iron which is good.  She has had an episode of melena in the past based on description.  Standard of care for iron deficiency anemia in her age group is an EGD and colonoscopy.  She has dwarfism, is wheelchair-bound, her case will need to be done at the hospital for anesthesia support.  Given her medical condition, with her height, her BMI is actually over 50.  She is small in stature and will likely use pediatric equipment for her case, but if she is willing to do the colonoscopy I think we should probably do both EGD and colonoscopy to evaluate the IDA.  Continue Protonix  in the interim and avoid NSAIDs.  Ellouise or POD A RN can you please let her know.  I see that she is scheduled with me in November for EGD, can we please add the colonoscopy as well if she is willing.  Thanks

## 2024-05-19 ENCOUNTER — Telehealth: Payer: Self-pay | Admitting: *Deleted

## 2024-05-19 DIAGNOSIS — R1084 Generalized abdominal pain: Secondary | ICD-10-CM

## 2024-05-19 DIAGNOSIS — K921 Melena: Secondary | ICD-10-CM

## 2024-05-19 DIAGNOSIS — D509 Iron deficiency anemia, unspecified: Secondary | ICD-10-CM

## 2024-05-19 NOTE — Telephone Encounter (Signed)
 Attempted to reach patient to discuss recommendations.

## 2024-05-19 NOTE — Telephone Encounter (Signed)
 Damien Music, RN left voicemail asking patient to return our call.

## 2024-05-19 NOTE — Telephone Encounter (Signed)
 Author: Leigh Elspeth SQUIBB, MD Service: Gastroenterology Author Type: Physician  Filed: 05/18/2024  5:56 PM Encounter Date: 05/12/2024 Status: Signed  Editor: Armbruster, Elspeth SQUIBB, MD (Physician)   Agree with assessment with the following thoughts.   History of iron deficiency anemia, recent labs show that she has responded to iron which is good.  She has had an episode of melena in the past based on description.   Standard of care for iron deficiency anemia in her age group is an EGD and colonoscopy.  She has dwarfism, is wheelchair-bound, her case will need to be done at the hospital for anesthesia support.  Given her medical condition, with her height, her BMI is actually over 50.  She is small in stature and will likely use pediatric equipment for her case, but if she is willing to do the colonoscopy I think we should probably do both EGD and colonoscopy to evaluate the IDA.  Continue Protonix  in the interim and avoid NSAIDs.   Ellouise or POD A RN can you please let her know.  I see that she is scheduled with me in November for EGD, can we please add the colonoscopy as well if she is willing.  Thanks

## 2024-05-20 MED ORDER — NA SULFATE-K SULFATE-MG SULF 17.5-3.13-1.6 GM/177ML PO SOLN: ORAL | 0 refills | Status: AC

## 2024-05-20 NOTE — Telephone Encounter (Signed)
 Spoke to patient to advise of recommendation for her to have endoscopy and colonoscopy for workup of iron deficiency anemia. Patient is in agreement with the addition of colonoscopy on to her already scheduled 07/07/24 hospital endoscopy.   I spoke to [facility] with [employer] and added colonoscopy with diagnosis of IDA.   Updated ambulatory referral to GI for endoscopy and colonoscopy for insurance pre-cert.   Prep has been sent to [facility] at [address]  Updated prep instructions have been created and sent to patient via mychart.   Patient verbalizes understanding of information and agrees to call back should she have any additional questions.

## 2024-05-20 NOTE — Telephone Encounter (Signed)
 Left message for patient to call back

## 2024-05-20 NOTE — Telephone Encounter (Signed)
 Patient returned call, please advise.

## 2024-06-29 ENCOUNTER — Telehealth: Payer: Self-pay | Admitting: Gastroenterology

## 2024-06-29 NOTE — Telephone Encounter (Signed)
 Spoke with patient.  Next avail was 1/26.  Patient opted to keep her current appointment. Created and sent prep instructions.  Patient already had SUPREP. Patient verbalized understanding.

## 2024-06-29 NOTE — Telephone Encounter (Signed)
 Inbound call from patient stating that she would like to know if she can have her procedure at the [facility] at a later time on the 25 th of November. Patient stated that if not she can just come in the the schedule time. Patient is requesting a call back. Please advise.

## 2024-06-30 ENCOUNTER — Encounter (HOSPITAL_COMMUNITY): Payer: Self-pay | Admitting: Gastroenterology

## 2024-06-30 ENCOUNTER — Telehealth: Payer: Self-pay

## 2024-06-30 NOTE — Telephone Encounter (Signed)
 Unable to reach pt. Message sent to Elenor Lee RN to see if she has been able to discuss upcoming procedure with pt. Will await further communication from [place].  Elenor has spoken with the pt regarding upcoming procedure.

## 2024-06-30 NOTE — Telephone Encounter (Signed)
 Procedure:EGD Procedure date: 07/07/24 Procedure location: [facility] Arrival Time: 8:11 Spoke with the patient Y/N: N Any prep concerns? N  Has the patient obtained the prep from the pharmacy ? N Do you have a care partner and transportation:N Any additional concerns? N   I called patient on 3 occasions and only got voice mail. I left a detailed message about the procedure and the office number in case the patient has questions are concerns.

## 2024-07-07 ENCOUNTER — Other Ambulatory Visit: Payer: Self-pay

## 2024-07-07 ENCOUNTER — Ambulatory Visit (HOSPITAL_COMMUNITY): Payer: Self-pay | Admitting: Certified Registered Nurse Anesthetist

## 2024-07-07 ENCOUNTER — Encounter (HOSPITAL_COMMUNITY)
Admission: RE | Disposition: A | Discharge status: Home / Self Care | Payer: Self-pay | Source: Home / Self Care | Attending: Gastroenterology

## 2024-07-07 ENCOUNTER — Encounter (HOSPITAL_COMMUNITY): Payer: Self-pay | Admitting: Gastroenterology

## 2024-07-07 ENCOUNTER — Ambulatory Visit (HOSPITAL_BASED_OUTPATIENT_CLINIC_OR_DEPARTMENT_OTHER): Payer: Self-pay | Admitting: Certified Registered Nurse Anesthetist

## 2024-07-07 ENCOUNTER — Ambulatory Visit (HOSPITAL_COMMUNITY)
Admission: RE | Admit: 2024-07-07 | Discharge: 2024-07-07 | Disposition: A | Discharge status: Home / Self Care | Attending: Gastroenterology | Admitting: Gastroenterology

## 2024-07-07 DIAGNOSIS — K921 Melena: Secondary | ICD-10-CM

## 2024-07-07 DIAGNOSIS — F419 Anxiety disorder, unspecified: Secondary | ICD-10-CM

## 2024-07-07 DIAGNOSIS — D509 Iron deficiency anemia, unspecified: Secondary | ICD-10-CM

## 2024-07-07 DIAGNOSIS — R1030 Lower abdominal pain, unspecified: Secondary | ICD-10-CM | POA: Insufficient documentation

## 2024-07-07 DIAGNOSIS — J45909 Unspecified asthma, uncomplicated: Secondary | ICD-10-CM

## 2024-07-07 DIAGNOSIS — K219 Gastro-esophageal reflux disease without esophagitis: Secondary | ICD-10-CM | POA: Diagnosis not present

## 2024-07-07 DIAGNOSIS — J4530 Mild persistent asthma, uncomplicated: Secondary | ICD-10-CM | POA: Insufficient documentation

## 2024-07-07 DIAGNOSIS — K2970 Gastritis, unspecified, without bleeding: Secondary | ICD-10-CM | POA: Diagnosis not present

## 2024-07-07 DIAGNOSIS — K2960 Other gastritis without bleeding: Secondary | ICD-10-CM | POA: Insufficient documentation

## 2024-07-07 DIAGNOSIS — D508 Other iron deficiency anemias: Secondary | ICD-10-CM | POA: Diagnosis present

## 2024-07-07 DIAGNOSIS — D649 Anemia, unspecified: Secondary | ICD-10-CM | POA: Insufficient documentation

## 2024-07-07 DIAGNOSIS — E34328 Other genetic causes of short stature: Secondary | ICD-10-CM | POA: Diagnosis not present

## 2024-07-07 HISTORY — PX: ESOPHAGOGASTRODUODENOSCOPY: SHX5428

## 2024-07-07 HISTORY — PX: COLONOSCOPY: SHX5424

## 2024-07-07 SURGERY — EGD (ESOPHAGOGASTRODUODENOSCOPY)
Anesthesia: Monitor Anesthesia Care

## 2024-07-07 MED ORDER — SODIUM CHLORIDE 0.9 % IV SOLN: INTRAVENOUS | Status: DC

## 2024-07-07 MED ORDER — PROPOFOL 500 MG/50ML IV EMUL
INTRAVENOUS | Status: DC | PRN
  Administered 2024-07-07 (×2): 20 mg via INTRAVENOUS
  Administered 2024-07-07: 100 ug/kg/min via INTRAVENOUS
  Administered 2024-07-07: 20 mg via INTRAVENOUS
  Administered 2024-07-07: 50 mg via INTRAVENOUS
  Administered 2024-07-07: 20 mg via INTRAVENOUS
  Administered 2024-07-07: 10 mg via INTRAVENOUS
  Administered 2024-07-07: 30 mg via INTRAVENOUS

## 2024-07-07 MED ORDER — PROPOFOL 1000 MG/100ML IV EMUL
INTRAVENOUS | Status: AC
  Filled 2024-07-07: qty 100

## 2024-07-07 MED ORDER — LIDOCAINE HCL (PF) 2 % IJ SOLN
INTRAMUSCULAR | Status: DC | PRN
  Administered 2024-07-07: 40 mg via INTRADERMAL

## 2024-07-07 NOTE — Anesthesia Postprocedure Evaluation (Signed)
 Anesthesia Post Note  Patient: Cindy Goodwin  Procedure(s) Performed: EGD (ESOPHAGOGASTRODUODENOSCOPY) COLONOSCOPY     Patient location during evaluation: Endoscopy Anesthesia Type: MAC Level of consciousness: awake Pain management: pain level controlled Vital Signs Assessment: post-procedure vital signs reviewed and stable Respiratory status: spontaneous breathing Cardiovascular status: blood pressure returned to baseline Postop Assessment: no apparent nausea or vomiting Anesthetic complications: no   No notable events documented.  Last Vitals:  Vitals:   07/07/24 1130 07/07/24 1140  BP: (!) 90/58 (!) 101/59  Pulse: 100 80  Resp: 18 19  Temp:    SpO2: 100% 96%    Last Pain:  Vitals:   07/07/24 1140  TempSrc:   PainSc: 0-No pain                 Lauraine KATHEE [place]

## 2024-07-07 NOTE — Transfer of Care (Signed)
 Immediate Anesthesia Transfer of Care Note  Patient: Cindy Goodwin  Procedure(s) Performed: Procedure(s): EGD (ESOPHAGOGASTRODUODENOSCOPY) (N/A) COLONOSCOPY (N/A)  Patient Location: PACU  Anesthesia Type:MAC  Level of Consciousness: Patient easily awoken, comfortable, cooperative, following commands, responds to stimulation.   Airway & Oxygen Therapy: Patient spontaneously breathing, ventilating well, oxygen via simple oxygen mask.  Post-op Assessment: Report given to PACU RN, vital signs reviewed and stable, moving all extremities.   Post vital signs: Reviewed and stable.  Complications: No apparent anesthesia complications  Last Vitals:  Vitals Value Taken Time  BP 94/52 07/07/24 1125  Temp    Pulse 106  07/07/24 1125  Resp 13 07/07/24 1125  SpO2 100 07/07/24 1125    Last Pain:  Vitals:   07/07/24 0918  TempSrc: Temporal  PainSc: 0-No pain         Complications: No notable events documented.

## 2024-07-07 NOTE — Anesthesia Procedure Notes (Signed)
 Procedure Name: MAC Date/Time: 07/07/2024 9:55 AM  Performed by: Joshua Vernell BROCKS, CRNAPre-anesthesia Checklist: Patient identified, Emergency Drugs available, Suction available and Patient being monitored Patient Re-evaluated:Patient Re-evaluated prior to induction Oxygen Delivery Method: Simple face mask Preoxygenation: Pre-oxygenation with 100% oxygen Placement Confirmation: positive ETCO2 and breath sounds checked- equal and bilateral

## 2024-07-07 NOTE — H&P (Signed)
 [facility] History and Physical   Primary Care Physician:  Shelda Atlas, MD   Reason for Procedure:   Iron deficiency, melena   Plan:    EGD, colonoscopy   HPI: Cindy Goodwin is a 38 y.o. female with dwarfism/diastrophic dysplasia undergoing EGD and colonoscopy to evaluate melena and iron deficiency.  She has no family history of colon cancer.  She has occasional lower abdominal pain.  Her melena has resolved since stopping NSAIDs and starting Protonix .   The patient was provided an opportunity to ask questions and all were answered. The patient agreed with the plan   Past Medical History:  Diagnosis Date   Allergic rhinitis    Anxiety    Cyst of right kidney    Diastrophic dysplasia    Dwarfism    Dysmenorrhea    Episodic tension-type headache, not intractable    Esophageal reflux    Hearing loss    Hyperlipidemia    Kidney stone    Kyphosis    Mild persistent asthma 12/31/2017   Palpitations    Scoliosis    Sleep apnea    Vertigo     Past Surgical History:  Procedure Laterality Date   HYSTERECTOMY ABDOMINAL WITH SALPINGECTOMY Bilateral 10/17/2021   Procedure: HYSTERECTOMY ABDOMINAL WITH SALPINGECTOMY;  Surgeon: Corene Coy, MD;  Location: [facility] OR;  Service: Gynecology;  Laterality: Bilateral;    Prior to Admission medications   Medication Sig Start Date End Date Taking? Authorizing Provider  albuterol  (PROVENTIL  HFA;VENTOLIN  HFA) 108 (90 Base) MCG/ACT inhaler Inhale 1-2 puffs into the lungs every 6 (six) hours as needed for wheezing or shortness of breath. 12/31/17  Yes Bobbitt, Elgin Pepper, MD  aspirin  EC 81 MG tablet Take 1 tablet (81 mg total) by mouth daily. Take after 12 weeks for prevention of preeclampsia later in pregnancy 10/18/21  Yes Corene Coy, MD  famotidine  (PEPCID ) 20 MG tablet Take 1 tablet (20 mg total) by mouth 2 (two) times daily. 04/04/22  Yes Christopher Savannah, PA-C  gabapentin (NEURONTIN) 100 MG capsule Take 100  mg by mouth at bedtime. 04/02/24  Yes [provider]  loratadine (CLARITIN) 10 MG tablet Take 10 mg by mouth daily as needed for allergies.   Yes [provider]  ondansetron  (ZOFRAN ) 4 MG tablet Take 4 mg by mouth every 8 (eight) hours as needed for nausea or vomiting.   Yes [provider]  pantoprazole  (PROTONIX ) 40 MG tablet Take 1 tablet (40 mg total) by mouth daily. 05/12/24  Yes [place], PA-C  dicyclomine  (BENTYL ) 10 MG capsule Take 1 capsule (10 mg total) by mouth 2 (two) times daily as needed for spasms. 09/12/22   Blaise Aleene KIDD, MD  hydrOXYzine (ATARAX) 25 MG tablet Take 25 mg by mouth as needed. 04/21/24   [provider]  Na Sulfate-K Sulfate-Mg Sulfate concentrate (SUPREP) 17.5-3.13-1.6 GM/177ML SOLN Use as directed; may use generic; goodrx card if insurance will not cover generic 05/20/24   [place], PA-C  ofloxacin  (FLOXIN ) 0.3 % OTIC solution Place 5 drops into the right ear daily. 09/12/22   Blaise Aleene KIDD, MD    Current Facility-Administered Medications  Medication Dose Route Frequency Provider Last Rate Last Admin   0.9 %  sodium chloride  infusion   Intravenous Continuous [place], PA-C        Allergies as of 05/12/2024 - Review Complete 05/12/2024  Allergen Reaction Noted   Bactrim  [sulfamethoxazole -trimethoprim ] Nausea And Vomiting 02/12/2023    Family History  Problem Relation Age  of Onset   Allergic rhinitis Maternal Aunt    Allergic rhinitis Maternal Uncle    Cancer Maternal Grandmother    Diabetes Neg Hx    Hypertension Neg Hx     Social History   Socioeconomic History   Marital status: Single    Spouse name: Not on file   Number of children: Not on file   Years of education: Not on file   Highest education level: Not on file  Occupational History   Not on file  Tobacco Use   Smoking status: Never   Smokeless tobacco: Never  Vaping Use   Vaping status: Never Used  Substance and Sexual Activity    Alcohol use: Yes    Comment: occasional   Drug use: No   Sexual activity: Not Currently    Birth control/protection: None  Other Topics Concern   Not on file  Social History Narrative   Not on file   Social Drivers of Health   Financial Resource Strain: Not on file  Food Insecurity: Not on file  Transportation Needs: Not on file  Physical Activity: Not on file  Stress: Not on file  Social Connections: Not on file  Intimate Partner Violence: Not on file    Review of Systems:  All other review of systems negative except as mentioned in the HPI.  Physical Exam: Vital signs BP 116/67   Pulse 80   Temp 97.7 F (36.5 C) (Temporal)   Resp 20   Ht 2' 4 (0.711 m)   Wt 29.5 kg   LMP 10/09/2021 (Exact Date)   SpO2 98%   BMI 58.32 kg/m   General:   Alert,  Well-developed, well-nourished, pleasant and cooperative in NAD.  Accompanied by friend Airway:  Mallampati 2 Lungs:  Clear throughout to auscultation.   Heart:  Regular rate and rhythm; no murmurs, clicks, rubs,  or gallops. Abdomen:  Soft, mild TTP in b/l lower quadrants, nondistended. Normal bowel sounds.   Neuro/Psych:  Normal mood and affect. A and O x 3   Lashica Hannay E. Stacia, MD [facility]

## 2024-07-07 NOTE — Op Note (Signed)
 [facility] Patient Name: Cindy Goodwin Procedure Date: 07/07/2024 MRN: [id number] Attending MD: Glendia BRAVO. Stacia , MD, [id number] Date of Birth: 1985-10-08 CSN: [id number] Age: 38 Admit Type: Outpatient Procedure:                Upper GI endoscopy Indications:              Iron deficiency anemia, Melena Providers:                Glendia E. Stacia, MD, Almarie Masters, RN, Felice Sar, Technician Referring MD:              Medicines:                Monitored Anesthesia Care Complications:            No immediate complications. Estimated Blood Loss:     Estimated blood loss was minimal. Procedure:                Pre-Anesthesia Assessment:                           - Prior to the procedure, a History and Physical                            was performed, and patient medications and                            allergies were reviewed. The patient's tolerance of                            previous anesthesia was also reviewed. The risks                            and benefits of the procedure and the sedation                            options and risks were discussed with the patient.                            All questions were answered, and informed consent                            was obtained. Prior Anticoagulants: The patient has                            taken no anticoagulant or antiplatelet agents. ASA                            Grade Assessment: II - A patient with mild systemic                            disease. After reviewing the risks and benefits,  the patient was deemed in satisfactory condition to                            undergo the procedure.                           After obtaining informed consent, the endoscope was                            passed under direct vision. Throughout the                            procedure, the patient's blood pressure, pulse, and                             oxygen saturations were monitored continuously. The                            GIF-H190 ([id number]) Olympus endoscope was introduced                            through the mouth, and advanced to the second part                            of duodenum. The upper GI endoscopy was                            accomplished without difficulty. The patient                            tolerated the procedure fairly well, but the                            patient's IV infiltrated during the procedure,                            resulting in suboptimal sedation levels towards the                            end of the procedure and an expedited exam. Scope In: Scope Out: Findings:      The examined portions of the nasopharynx, oropharynx and larynx were       normal.      The examined esophagus was normal.      The entire examined stomach was normal. Biopsies were taken with a cold       forceps for Helicobacter pylori testing. Estimated blood loss was       minimal.      The examined duodenum was normal. Impression:               - The examined portions of the nasopharynx,                            oropharynx and larynx were normal.                           -  Normal esophagus.                           - Normal stomach. Biopsied.                           - Normal examined duodenum.                           - No endoscopic abnormalities to explain melena/IDA                           - Suspect patient may have had an ulcer or                            gastritis previously which has since healed with                            cessation of NSAIDs and addition of Protonix . Moderate Sedation:      Not Applicable - Patient had care per Anesthesia. Recommendation:           - Patient has a contact number available for                            emergencies. The signs and symptoms of potential                            delayed complications were discussed with the                             patient. Return to normal activities tomorrow.                            Written discharge instructions were provided to the                            patient.                           - Resume previous diet.                           - Continue present medications.                           - Await pathology results.                           - Proceed with colonoscopy                           - Ok to discontinue Protonix . Procedure Code(s):        --- Professional ---                           [id number], Esophagogastroduodenoscopy, flexible,  transoral; with biopsy, single or multiple Diagnosis Code(s):        --- Professional ---                           D50.9, Iron deficiency anemia, unspecified                           K92.1, Melena (includes Hematochezia) CPT copyright 2022 [employer]. All rights reserved. The codes documented in this report are preliminary and upon coder review may  be revised to meet current compliance requirements. Joylynn Defrancesco E. Stacia, MD 07/07/2024 11:22:24 AM This report has been signed electronically. Number of Addenda: 0

## 2024-07-07 NOTE — Op Note (Signed)
 [facility] Patient Name: Cindy Goodwin Procedure Date: 07/07/2024 MRN: [id number] Attending MD: Glendia BRAVO. Stacia , MD, [id number] Date of Birth: [birth year] CSN: [id number] Age: 38 Admit Type: Outpatient Procedure:                Colonoscopy Indications:              Iron deficiency anemia Providers:                Glendia E. Stacia, MD, Almarie Masters, RN, Felice Sar, Technician Referring MD:              Medicines:                Monitored Anesthesia Care Complications:            No immediate complications. Estimated Blood Loss:     Estimated blood loss: none. Procedure:                Pre-Anesthesia Assessment:                           - Prior to the procedure, a History and Physical                            was performed, and patient medications and                            allergies were reviewed. The patient's tolerance of                            previous anesthesia was also reviewed. The risks                            and benefits of the procedure and the sedation                            options and risks were discussed with the patient.                            All questions were answered, and informed consent                            was obtained. Prior Anticoagulants: The patient has                            taken no anticoagulant or antiplatelet agents. ASA                            Grade Assessment: II - A patient with mild systemic                            disease. After reviewing the risks and benefits,  the patient was deemed in satisfactory condition to                            undergo the procedure.                           After obtaining informed consent, the colonoscope                            was passed under direct vision. Throughout the                            procedure, the patient's blood pressure, pulse, and                            oxygen  saturations were monitored continuously. The                            GIF-H190 ([id number]) Olympus endoscope was introduced                            through the anus and advanced to the the cecum,                            identified by appendiceal orifice and ileocecal                            valve. The colonoscopy was performed without                            difficulty. The patient tolerated the procedure                            well. The quality of the bowel preparation was                            adequate. The ileocecal valve, appendiceal orifice,                            and rectum were photographed. The bowel preparation                            used was SUPREP via split dose instruction. Scope In: 10:58:34 AM Scope Out: 11:14:55 AM Scope Withdrawal Time: 0 hours 12 minutes 52 seconds  Total Procedure Duration: 0 hours 16 minutes 21 seconds  Findings:      The perianal and digital rectal examinations were normal. Pertinent       negatives include normal sphincter tone and no palpable rectal lesions.      The colon (entire examined portion) appeared normal.      The retroflexed view of the distal rectum and anal verge was normal and       showed no anal or rectal abnormalities. Impression:               - The entire examined colon is normal.                           -  The distal rectum and anal verge are normal on                            retroflexion view.                           - No specimens collected.                           - No abnormalities to explain iron deficiency. Moderate Sedation:      Not Applicable - Patient had care per Anesthesia. Recommendation:           - Patient has a contact number available for                            emergencies. The signs and symptoms of potential                            delayed complications were discussed with the                            patient. Return to normal activities tomorrow.                             Written discharge instructions were provided to the                            patient.                           - Resume previous diet.                           - Continue present medications.                           - Repeat colonoscopy in 10 years for screening                            purposes.                           - Continue to periodically monitor hemoglobin/iron                            levels. Procedure Code(s):        --- Professional ---                           [phone], Colonoscopy, flexible; diagnostic, including                            collection of specimen(s) by brushing or washing,                            when performed (separate procedure) Diagnosis Code(s):        --- Professional ---  D50.9, Iron deficiency anemia, unspecified CPT copyright 2022 American [employer] Association. All rights reserved. The codes documented in this report are preliminary and upon coder review may  be revised to meet current compliance requirements. Nancyjo Givhan E. Stacia, MD 07/07/2024 11:20:36 AM This report has been signed electronically. Number of Addenda: 0

## 2024-07-07 NOTE — Anesthesia Preprocedure Evaluation (Addendum)
 Anesthesia Evaluation  Patient identified by MRN, date of birth, ID band Patient awake    Reviewed: Allergy & Precautions, NPO status , Patient's Chart, lab work & pertinent test results  History of Anesthesia Complications Negative for: history of anesthetic complications  Airway Mallampati: I  TM Distance: >3 FB Neck ROM: Full    Dental  (+) Teeth Intact, Dental Advisory Given   Pulmonary asthma    breath sounds clear to auscultation       Cardiovascular  Rhythm:Regular Rate:Normal     Neuro/Psych  Headaches  Anxiety        GI/Hepatic ,GERD  Medicated and Controlled,,  Endo/Other    Renal/GU Renal diseaseHx of Kidney Stones     Musculoskeletal Dwarfism; Scoliosis, Kyphosis   Abdominal   Peds  Hematology  (+) Blood dyscrasia, anemia Hgb 13.3, Plts 293K   Anesthesia Other Findings   Reproductive/Obstetrics                              Anesthesia Physical Anesthesia Plan  ASA: 3  Anesthesia Plan: MAC   Post-op Pain Management:    Induction: Intravenous  PONV Risk Score and Plan: 2 and Treatment may vary due to age or medical condition and Propofol  infusion  Airway Management Planned: Natural Airway and Nasal Cannula  Additional Equipment: None  Intra-op Plan:   Post-operative Plan:   Informed Consent:      Dental advisory given  Plan Discussed with: CRNA  Anesthesia Plan Comments:          Anesthesia Quick Evaluation

## 2024-07-07 NOTE — Discharge Instructions (Signed)
YOU HAD AN ENDOSCOPIC PROCEDURE TODAY: Refer to the procedure report and other information in the discharge instructions given to you for any specific questions about what was found during the examination. If this information does not answer your questions, please call [place] office at [phone] to clarify.   YOU SHOULD EXPECT: Some feelings of bloating in the abdomen. Passage of more gas than usual. Walking can help get rid of the air that was put into your GI tract during the procedure and reduce the bloating. If you had a lower endoscopy (such as a colonoscopy or flexible sigmoidoscopy) you may notice spotting of blood in your stool or on the toilet paper. Some abdominal soreness may be present for a day or two, also.  DIET: Your first meal following the procedure should be a light meal and then it is ok to progress to your normal diet. A half-sandwich or bowl of soup is an example of a good first meal. Heavy or fried foods are harder to digest and may make you feel nauseous or bloated. Drink plenty of fluids but you should avoid alcoholic beverages for 24 hours.   ACTIVITY: Your care partner should take you home directly after the procedure. You should plan to take it easy, moving slowly for the rest of the day. You can resume normal activity the day after the procedure however YOU SHOULD NOT DRIVE, use power tools, machinery or perform tasks that involve climbing or major physical exertion for 24 hours (because of the sedation medicines used during the test).   SYMPTOMS TO REPORT IMMEDIATELY: A gastroenterologist can be reached at any hour. Please call [phone]  for any of the following symptoms:  Following lower endoscopy (colonoscopy, flexible sigmoidoscopy) Excessive amounts of blood in the stool  Significant tenderness, worsening of abdominal pains  Swelling of the abdomen that is new, acute  Fever of 100 or higher  Following upper endoscopy (EGD, EUS, ERCP, esophageal  dilation) Vomiting of blood or coffee ground material  New, significant abdominal pain  New, significant chest pain or pain under the shoulder blades  Painful or persistently difficult swallowing  New shortness of breath  Black, tarry-looking or red, bloody stools  FOLLOW UP:  If any biopsies were taken you will be contacted by phone or by letter within the next 1-3 weeks. Call [phone]  if you have not heard about the biopsies in 3 weeks.  Please also call with any specific questions about appointments or follow up tests.

## 2024-07-08 ENCOUNTER — Telehealth: Payer: Self-pay | Admitting: Gastroenterology

## 2024-07-08 ENCOUNTER — Encounter (HOSPITAL_COMMUNITY): Payer: Self-pay | Admitting: Gastroenterology

## 2024-07-08 LAB — SURGICAL PATHOLOGY

## 2024-07-08 NOTE — Telephone Encounter (Signed)
 PT had an EGD/colon on yesterday and has severe pain in her chest and she has the taste of blood in her mouth. Please advise.

## 2024-07-13 NOTE — Telephone Encounter (Signed)
 Left message for patient to call back

## 2024-07-14 ENCOUNTER — Ambulatory Visit: Payer: Self-pay | Admitting: Gastroenterology

## 2024-07-14 NOTE — Progress Notes (Signed)
 Cindy Goodwin,  The biopsies taken from your stomach were notable for mild chronic gastritis (inflammation) which is a common finding, but there was no evidence of Helicobacter pylori infection. This common finding is not likely to explain anemia and there is no specific treatment or further evaluation recommended.

## 2024-07-14 NOTE — Telephone Encounter (Signed)
 Left message to call back.

## 2024-07-14 NOTE — Telephone Encounter (Signed)
 Spoke with patient who states that her severe chest pain and blood taste in her mouth have improved. She is still having some chest discomfort and belching but thinks this is steadily improving. She has not had any evidence of bleeding with hematochezia or melena and no longer has the blood taste sensation. Patient is advised that if she develops worsening symptoms again, she should let us  know but it is reassuring that symptoms have steadily improved.  Patient has also been advised of results from endoscopy and verbalizes understanding.

## 2024-07-22 ENCOUNTER — Other Ambulatory Visit: Payer: Self-pay | Admitting: Physician Assistant
